# Patient Record
Sex: Female | Born: 1990 | Race: Black or African American | Hispanic: No | Marital: Single | Smoking: Never smoker
Health system: Southern US, Community
[De-identification: ages and names within clinical notes are randomized; demographics above are authoritative.]

## PROBLEM LIST (undated history)

## (undated) ENCOUNTER — Inpatient Hospital Stay (HOSPITAL_COMMUNITY): Payer: Self-pay

## (undated) DIAGNOSIS — Z789 Other specified health status: Secondary | ICD-10-CM

## (undated) HISTORY — PX: NO PAST SURGERIES: SHX2092

---

## 2014-02-03 ENCOUNTER — Ambulatory Visit: Payer: Self-pay | Admitting: Obstetrics & Gynecology

## 2014-10-04 ENCOUNTER — Encounter: Payer: Self-pay | Admitting: *Deleted

## 2014-10-05 ENCOUNTER — Encounter: Payer: Self-pay | Admitting: Obstetrics & Gynecology

## 2016-05-11 ENCOUNTER — Encounter: Payer: Self-pay | Admitting: Student

## 2016-05-11 ENCOUNTER — Inpatient Hospital Stay (HOSPITAL_COMMUNITY)
Admission: AD | Admit: 2016-05-11 | Discharge: 2016-05-11 | Disposition: A | Discharge status: Home / Self Care | Payer: Medicaid Other | Source: Ambulatory Visit | Attending: Obstetrics & Gynecology | Admitting: Obstetrics & Gynecology

## 2016-05-11 DIAGNOSIS — O219 Vomiting of pregnancy, unspecified: Secondary | ICD-10-CM

## 2016-05-11 DIAGNOSIS — O210 Mild hyperemesis gravidarum: Secondary | ICD-10-CM | POA: Insufficient documentation

## 2016-05-11 DIAGNOSIS — Z3A08 8 weeks gestation of pregnancy: Secondary | ICD-10-CM | POA: Insufficient documentation

## 2016-05-11 DIAGNOSIS — Z79899 Other long term (current) drug therapy: Secondary | ICD-10-CM | POA: Insufficient documentation

## 2016-05-11 LAB — URINALYSIS, ROUTINE W REFLEX MICROSCOPIC
GLUCOSE, UA: NEGATIVE mg/dL
HGB URINE DIPSTICK: NEGATIVE
LEUKOCYTES UA: NEGATIVE
Nitrite: NEGATIVE
PH: 5.5 (ref 5.0–8.0)
Protein, ur: 30 mg/dL — AB
Specific Gravity, Urine: >1.03 — ABNORMAL HIGH (ref 1.005–1.030)

## 2016-05-11 LAB — URINE MICROSCOPIC-ADD ON
Bacteria, UA: NONE SEEN
RBC / HPF: NONE SEEN RBC/hpf (ref 0–5)
WBC UA: NONE SEEN WBC/hpf (ref 0–5)

## 2016-05-11 LAB — POCT PREGNANCY, URINE: PREG TEST UR: POSITIVE — AB

## 2016-05-11 MED ORDER — DEXTROSE 5 % IN LACTATED RINGERS IV BOLUS
1000.0000 mL | Freq: Once | INTRAVENOUS | Status: AC
  Administered 2016-05-11: 1000 mL via INTRAVENOUS

## 2016-05-11 MED ORDER — METOCLOPRAMIDE HCL 5 MG/ML IJ SOLN
10.0000 mg | Freq: Once | INTRAMUSCULAR | Status: AC
  Administered 2016-05-11: 10 mg via INTRAVENOUS
  Filled 2016-05-11: qty 2

## 2016-05-11 MED ORDER — LACTATED RINGERS IV SOLN
Freq: Once | INTRAVENOUS | Status: AC
  Administered 2016-05-11: 19:00:00 via INTRAVENOUS
  Filled 2016-05-11: qty 1000

## 2016-05-11 MED ORDER — METOCLOPRAMIDE HCL 10 MG PO TABS: 10.0000 mg | ORAL_TABLET | Freq: Three times a day (TID) | ORAL | 1 refills | Status: DC

## 2016-05-11 NOTE — Discharge Instructions (Signed)

## 2016-05-11 NOTE — MAU Provider Note (Signed)
History     CSN: [id number]  Arrival date and time: 05/11/16 1539   First Provider Initiated Contact with Patient 05/11/16 1731      Chief Complaint  Patient presents with  . Morning Sickness   HPI   [name] is a 25 y.o. female G1P0 at [redacted]w[redacted]d here with N/V; she attests to vomiting at least 6 times per day. She has not tried anything for the symptoms.  She has her first appt with OBGYN next week.    OB History    Gravida Para Term Preterm AB Living   1             SAB TAB Ectopic Multiple Live Births                  History reviewed. No pertinent past medical history.  History reviewed. No pertinent surgical history.  History reviewed. No pertinent family history.  Social History  Substance Use Topics  . Smoking status: Never Smoker  . Smokeless tobacco: Never Used  . Alcohol use No    Allergies: No Known Allergies  Prescriptions Prior to Admission  Medication Sig Dispense Refill Last Dose  . Prenatal Vit-Fe Fumarate-FA (PRENATAL MULTIVITAMIN) TABS tablet Take 1 tablet by mouth daily at 12 noon.   Past Week at Unknown time   Results for orders placed or performed during the hospital encounter of 05/11/16 (from the past 48 hour(s))  Urinalysis, Routine w reflex microscopic (not at [facility])     Status: Abnormal   Collection Time: 05/11/16  3:55 PM  Result Value Ref Range   Color, Urine YELLOW YELLOW   APPearance HAZY (A) CLEAR   Specific Gravity, Urine >1.030 (H) 1.005 - 1.030   pH 5.5 5.0 - 8.0   Glucose, UA NEGATIVE NEGATIVE mg/dL   Hgb urine dipstick NEGATIVE NEGATIVE   Bilirubin Urine MODERATE (A) NEGATIVE   Ketones, ur >80 (A) NEGATIVE mg/dL   Protein, ur 30 (A) NEGATIVE mg/dL   Nitrite NEGATIVE NEGATIVE   Leukocytes, UA NEGATIVE NEGATIVE  Urine microscopic-add on     Status: Abnormal   Collection Time: 05/11/16  3:55 PM  Result Value Ref Range   Squamous Epithelial / LPF 0-5 (A) NONE SEEN   WBC, UA NONE SEEN 0 - 5 WBC/hpf   RBC / HPF NONE SEEN 0  - 5 RBC/hpf   Bacteria, UA NONE SEEN NONE SEEN   Urine-Other MUCOUS PRESENT     Comment: AMORPHOUS URATES/PHOSPHATES  Pregnancy, urine POC     Status: Abnormal   Collection Time: 05/11/16  4:05 PM  Result Value Ref Range   Preg Test, Ur POSITIVE (A) NEGATIVE    Comment:        THE SENSITIVITY OF THIS METHODOLOGY IS >24 mIU/mL     Review of Systems  Constitutional: Negative for chills and fever.  Gastrointestinal: Positive for nausea and vomiting. Negative for abdominal pain.  Genitourinary:       Denies vaginal bleeding    Physical Exam   Blood pressure 154/78, pulse 104, temperature 98.1 F (36.7 C), resp. rate 18, weight 228 lb (103.4 kg), last menstrual period 03/14/2016.  Physical Exam  Constitutional: She is oriented to person, place, and time. She appears well-developed and well-nourished. No distress.  HENT:  Head: Normocephalic.  Eyes: Pupils are equal, round, and reactive to light.  Respiratory: Effort normal.  Musculoskeletal: Normal range of motion.  Neurological: She is alert and oriented to person, place, and time.  Skin: Skin  is warm. She is not diaphoretic.  Psychiatric: Her behavior is normal.    MAU Course  Procedures  None  MDM  Urine shows > 80 ketones D5LR bolus LR bolus with MVI X 1 Reglan 10 mg IV X 1  Patient tolerating PO fluids and crackers. No vomiting noted in MAU.  Discussed patient with Dr. Langston Masker.   Assessment and Plan    A:   1. Nausea and vomiting during pregnancy     P:  Discharge home in stable condition Rx: Reglan Small, frequent meals Keep your next appointment with OB Return precautions discussed  First trimester warning signs discussed   Maureen Lope, NP 05/11/2016 7:42 PM

## 2016-05-11 NOTE — MAU Note (Signed)
Pt presents to MAU with complaints of nausea and vomiting for approximately a week. States that she is not able to keep anything down. Denies any vaginal bleeding or abnormal discharge

## 2016-05-29 ENCOUNTER — Ambulatory Visit (INDEPENDENT_AMBULATORY_CARE_PROVIDER_SITE_OTHER): Payer: Medicaid Other | Admitting: Obstetrics & Gynecology

## 2016-05-29 ENCOUNTER — Encounter: Payer: Self-pay | Admitting: Obstetrics & Gynecology

## 2016-05-29 ENCOUNTER — Telehealth: Payer: Self-pay | Admitting: *Deleted

## 2016-05-29 VITALS — BP 125/85 | HR 93 | Wt 225.0 lb

## 2016-05-29 DIAGNOSIS — Z3401 Encounter for supervision of normal first pregnancy, first trimester: Secondary | ICD-10-CM | POA: Diagnosis not present

## 2016-05-29 DIAGNOSIS — Z3490 Encounter for supervision of normal pregnancy, unspecified, unspecified trimester: Secondary | ICD-10-CM

## 2016-05-29 DIAGNOSIS — Z3492 Encounter for supervision of normal pregnancy, unspecified, second trimester: Secondary | ICD-10-CM

## 2016-05-29 NOTE — Telephone Encounter (Signed)
Order for MFM Nuchal order placed in system wrong.  Order corrected.

## 2016-05-30 DIAGNOSIS — Z3400 Encounter for supervision of normal first pregnancy, unspecified trimester: Secondary | ICD-10-CM | POA: Insufficient documentation

## 2016-05-30 NOTE — Progress Notes (Signed)
  Subjective:    Maureen [name] is a G1P0 4634w0d being seen today for her first obstetrical visit.  Her obstetrical history is significant for obesity. Patient does intend to breast feed. Pregnancy history fully reviewed.  Patient reports nausea.  Vitals:   05/29/16 1114  BP: 125/85  Pulse: 93  Weight: 225 lb (102.1 kg)    HISTORY: OB History  Gravida Para Term Preterm AB Living  1            SAB TAB Ectopic Multiple Live Births               # Outcome Date GA Lbr Len/2nd Weight Sex Delivery Anes PTL Lv  1 Current              No past medical history on file. No past surgical history on file. Family History  Problem Relation Age of Onset  . Diabetes Maternal Uncle   . Hypertension Maternal Grandmother   . Birth defects Neg Hx      Exam    Uterus:     Pelvic Exam:    Perineum: No Hemorrhoids   Vulva: normal   Vagina:  normal mucosa   pH:     Cervix: no lesions   Adnexa: normal adnexa   Bony Pelvis: average  System: Breast:  normal appearance, no masses or tenderness   Skin: normal coloration and turgor, no rashes    Neurologic: oriented, normal mood   Extremities: normal strength, tone, and muscle mass   HEENT thyroid without masses   Mouth/Teeth dental hygiene good   Neck supple   Cardiovascular: regular rate and rhythm, no murmurs or gallops   Respiratory:  appears well, vitals normal, no respiratory distress, acyanotic, normal RR, neck free of mass or lymphadenopathy, chest clear, no wheezing, crepitations, rhonchi, normal symmetric air entry   Abdomen: soft, non-tender; bowel sounds normal; no masses,  no organomegaly   Urinary: urethral meatus normal      Assessment:    Pregnancy: G1P0 Patient Active Problem List   Diagnosis Date Noted  . Supervision of normal first pregnancy, antepartum 05/30/2016        Plan:     Initial labs drawn. Prenatal vitamins. Problem list reviewed and updated. Genetic Screening discussed First Screen: ordered.  Ultrasound discussed; fetal survey: 18 weeks.  Follow up in 4 weeks. 50% of 30 min visit spent on counseling and coordination of care.  AFP after 15 weeks   ARNOLD,JAMES 05/30/2016

## 2016-05-31 LAB — PAP IG W/ RFLX HPV ASCU: PAP Smear Comment: 0

## 2016-05-31 LAB — GC/CHLAMYDIA PROBE AMP
CHLAMYDIA, DNA PROBE: NEGATIVE
Neisseria gonorrhoeae by PCR: NEGATIVE

## 2016-06-02 LAB — CULTURE, URINE COMPREHENSIVE

## 2016-06-04 ENCOUNTER — Encounter (HOSPITAL_COMMUNITY): Payer: Self-pay | Admitting: Obstetrics & Gynecology

## 2016-06-04 LAB — PRENATAL PROFILE I(LABCORP)
ANTIBODY SCREEN: NEGATIVE
BASOS ABS: 0 10*3/uL (ref 0.0–0.2)
Basos: 0 %
EOS (ABSOLUTE): 0 10*3/uL (ref 0.0–0.4)
EOS: 1 %
HEMOGLOBIN: 12.3 g/dL (ref 11.1–15.9)
Hematocrit: 36.8 % (ref 34.0–46.6)
Hepatitis B Surface Ag: NEGATIVE
IMMATURE GRANULOCYTES: 0 %
Immature Grans (Abs): 0 10*3/uL (ref 0.0–0.1)
LYMPHS ABS: 1.6 10*3/uL (ref 0.7–3.1)
Lymphs: 22 %
MCH: 27.1 pg (ref 26.6–33.0)
MCHC: 33.4 g/dL (ref 31.5–35.7)
MCV: 81 fL (ref 79–97)
MONOCYTES: 9 %
Monocytes Absolute: 0.6 10*3/uL (ref 0.1–0.9)
NEUTROS PCT: 68 %
Neutrophils Absolute: 4.9 10*3/uL (ref 1.4–7.0)
PLATELETS: 306 10*3/uL (ref 150–379)
RBC: 4.54 x10E6/uL (ref 3.77–5.28)
RDW: 15.6 % — ABNORMAL HIGH (ref 12.3–15.4)
RH TYPE: POSITIVE
RPR Ser Ql: NONREACTIVE
RUBELLA: 2.82 {index} (ref >0.99)
WBC: 7.1 10*3/uL (ref 3.4–10.8)

## 2016-06-04 LAB — HEMOGLOBINOPATHY EVALUATION
HGB A: 97.7 % (ref 94.0–98.0)
HGB C: 0 %
HGB S: 0 %
Hemoglobin A2 Quantitation: 2.3 % (ref 0.7–3.1)
Hemoglobin F Quantitation: 0 % (ref 0.0–2.0)

## 2016-06-04 LAB — HIV ANTIBODY (ROUTINE TESTING W REFLEX): HIV SCREEN 4TH GENERATION: NONREACTIVE

## 2016-06-04 LAB — OPIATE, QUANTITATIVE, URINE
OPIATES: NEGATIVE
OXYCODONE+OXYMORPHONE UR QL SCN: NEGATIVE

## 2016-06-14 ENCOUNTER — Ambulatory Visit (HOSPITAL_COMMUNITY)
Admission: RE | Admit: 2016-06-14 | Discharge: 2016-06-14 | Disposition: A | Discharge status: Home / Self Care | Payer: Medicaid Other | Source: Ambulatory Visit | Attending: Obstetrics & Gynecology | Admitting: Obstetrics & Gynecology

## 2016-06-14 ENCOUNTER — Encounter (HOSPITAL_COMMUNITY): Payer: Self-pay

## 2016-06-14 DIAGNOSIS — Z3A13 13 weeks gestation of pregnancy: Secondary | ICD-10-CM | POA: Insufficient documentation

## 2016-06-14 DIAGNOSIS — Z36 Encounter for antenatal screening of mother: Secondary | ICD-10-CM | POA: Diagnosis present

## 2016-06-14 DIAGNOSIS — Z3492 Encounter for supervision of normal pregnancy, unspecified, second trimester: Secondary | ICD-10-CM

## 2016-06-14 DIAGNOSIS — Z3402 Encounter for supervision of normal first pregnancy, second trimester: Secondary | ICD-10-CM

## 2016-06-14 HISTORY — DX: Other specified health status: Z78.9

## 2016-06-20 ENCOUNTER — Other Ambulatory Visit (HOSPITAL_COMMUNITY): Payer: Self-pay

## 2016-06-21 ENCOUNTER — Encounter: Payer: Self-pay | Admitting: *Deleted

## 2016-06-26 ENCOUNTER — Ambulatory Visit (INDEPENDENT_AMBULATORY_CARE_PROVIDER_SITE_OTHER): Payer: Medicaid Other | Admitting: Obstetrics and Gynecology

## 2016-06-26 DIAGNOSIS — Z3402 Encounter for supervision of normal first pregnancy, second trimester: Secondary | ICD-10-CM | POA: Diagnosis not present

## 2016-06-26 NOTE — Progress Notes (Signed)
Patient states that she overall feels well, other than having ocassional nausea.

## 2016-06-26 NOTE — Progress Notes (Signed)
Subjective:  Maureen DillonJasmine [name] is a 25 y.o. G1P0 at 5424w6d being seen today for ongoing prenatal care.  She is currently monitored for the following issues for this low-risk pregnancy and has Supervision of normal first pregnancy, antepartum on her problem list.  Patient reports nausea.  Contractions: Not present. Vag. Bleeding: None.  Movement: Present. Denies leaking of fluid.   The following portions of the patient's history were reviewed and updated as appropriate: allergies, current medications, past family history, past medical history, past social history, past surgical history and problem list. Problem list updated.  Objective:   Vitals:   06/26/16 0853  BP: 121/80  Pulse: (!) 102  Temp: 98.8 F (37.1 C)  Weight: 218 lb 9.6 oz (99.2 kg)    Fetal Status:     Movement: Present     General:  Alert, oriented and cooperative. Patient is in no acute distress.  Skin: Skin is warm and dry. No rash noted.   Cardiovascular: Normal heart rate noted  Respiratory: Normal respiratory effort, no problems with respiration noted  Abdomen: Soft, gravid, appropriate for gestational age. Pain/Pressure: Absent     Pelvic:  Cervical exam deferred        Extremities: Normal range of motion.  Edema: None  Mental Status: Normal mood and affect. Normal behavior. Normal judgment and thought content.   Urinalysis:      Assessment and Plan:  Pregnancy: G1P0 at 4924w6d  1. Supervision of normal first pregnancy, antepartum, second trimester States able to handle N/V. Only occurs every few days. Diet modifications reviewed with pt Will order U/S at next visit  Preterm labor symptoms and general obstetric precautions including but not limited to vaginal bleeding, contractions, leaking of fluid and fetal movement were reviewed in detail with the patient. Please refer to After Visit Summary for other counseling recommendations.  Return in about 4 weeks (around 07/24/2016) for OB visit.   Hermina StaggersMichael L Ervin,  MD

## 2016-07-17 ENCOUNTER — Ambulatory Visit (INDEPENDENT_AMBULATORY_CARE_PROVIDER_SITE_OTHER): Payer: Medicaid Other | Admitting: Obstetrics & Gynecology

## 2016-07-17 VITALS — BP 118/74 | HR 101 | Temp 98.2°F | Wt 224.6 lb

## 2016-07-17 DIAGNOSIS — Z3402 Encounter for supervision of normal first pregnancy, second trimester: Secondary | ICD-10-CM

## 2016-07-17 DIAGNOSIS — Z3400 Encounter for supervision of normal first pregnancy, unspecified trimester: Secondary | ICD-10-CM

## 2016-07-17 NOTE — Progress Notes (Signed)
   PRENATAL VISIT NOTE  Subjective:  Maureen [name] is a 25 y.o. G1P0 at 5050w6d being seen today for ongoing prenatal care.  She is currently monitored for the following issues for this low-risk pregnancy and has Supervision of normal first pregnancy, antepartum on her problem list.  Patient reports no complaints.  Contractions: Not present. Vag. Bleeding: None.  Movement: Present. Denies leaking of fluid.   The following portions of the patient's history were reviewed and updated as appropriate: allergies, current medications, past family history, past medical history, past social history, past surgical history and problem list. Problem list updated.  Objective:   Vitals:   07/17/16 0846  BP: 118/74  Pulse: (!) 101  Temp: 98.2 F (36.8 C)  Weight: 224 lb 9.6 oz (101.9 kg)    Fetal Status:     Movement: Present     General:  Alert, oriented and cooperative. Patient is in no acute distress.  Skin: Skin is warm and dry. No rash noted.   Cardiovascular: Normal heart rate noted  Respiratory: Normal respiratory effort, no problems with respiration noted  Abdomen: Soft, gravid, appropriate for gestational age. Pain/Pressure: Absent     Pelvic:  Cervical exam deferred        Extremities: Normal range of motion.  Edema: None  Mental Status: Normal mood and affect. Normal behavior. Normal judgment and thought content.   Urinalysis:      Assessment and Plan:  Pregnancy: G1P0 at 2150w6d  1. Supervision of normal first pregnancy, antepartum AFP only, nl NT - AFP, Quad Screen - US OB DETAIL + 14 WK; Future 2 weeks Preterm labor symptoms and general obstetric precautions including but not limited to vaginal bleeding, contractions, leaking of fluid and fetal movement were reviewed in detail with the patient. Please refer to After Visit Summary for other counseling recommendations.  Return in about 4 weeks (around 08/14/2016).  Adam PhenixJames G Arnold, MD

## 2016-07-17 NOTE — Progress Notes (Signed)
Patient is in office and states that she feels good and felt her first fetal kick the other day.

## 2016-07-24 LAB — AFP, QUAD SCREEN
DIA Mom Value: 1.31
DIA Value (EIA): 179.46 pg/mL
DSR (By Age)    1 IN: 1071
DSR (Second Trimester) 1 IN: 326
GESTATIONAL AGE AFP: 17.9 wk
MSAFP MOM: 0.7
MSAFP: 25.3 ng/mL
MSHCG MOM: 1.89
MSHCG: 40539 m[IU]/mL
Maternal Age At EDD: 24 YEARS
Osb Risk: 10000
Test Results:: NEGATIVE
UE3 MOM: 0.64
UE3 VALUE: 0.74 ng/mL
WEIGHT: 228 [lb_av]

## 2016-08-03 ENCOUNTER — Ambulatory Visit (INDEPENDENT_AMBULATORY_CARE_PROVIDER_SITE_OTHER): Payer: Medicaid Other

## 2016-08-03 DIAGNOSIS — Z3400 Encounter for supervision of normal first pregnancy, unspecified trimester: Secondary | ICD-10-CM

## 2016-08-03 DIAGNOSIS — Z3689 Encounter for other specified antenatal screening: Secondary | ICD-10-CM | POA: Diagnosis not present

## 2016-08-03 DIAGNOSIS — Z3402 Encounter for supervision of normal first pregnancy, second trimester: Secondary | ICD-10-CM | POA: Diagnosis not present

## 2016-08-14 ENCOUNTER — Ambulatory Visit (INDEPENDENT_AMBULATORY_CARE_PROVIDER_SITE_OTHER): Payer: Medicaid Other | Admitting: Certified Nurse Midwife

## 2016-08-14 DIAGNOSIS — Z3402 Encounter for supervision of normal first pregnancy, second trimester: Secondary | ICD-10-CM

## 2016-08-14 DIAGNOSIS — Z3400 Encounter for supervision of normal first pregnancy, unspecified trimester: Secondary | ICD-10-CM

## 2016-08-14 NOTE — Progress Notes (Signed)
Pt states that she is feeling movement less that before. Pediatrician list to be given today.

## 2016-08-14 NOTE — Progress Notes (Signed)
Subjective:    Ricard DillonJasmine Pankratz is a 25 y.o. female being seen today for her obstetrical visit. She is at 5918w6d gestation. Patient reports: no complaints . Fetal movement: normal.  Problem List Items Addressed This Visit      Other   Supervision of normal first pregnancy, antepartum     Patient Active Problem List   Diagnosis Date Noted  . Supervision of normal first pregnancy, antepartum 05/30/2016   Objective:    BP 108/73   Pulse 96   Wt 228 lb (103.4 kg)   LMP 03/14/2016 (Exact Date)  FHT: 146 BPM  Uterine Size: 22 cm and size equals dates     Assessment:    Pregnancy @ 4218w6d    Influenza today  Plan:    OBGCT: discussed. Signs and symptoms of preterm labor: discussed and handout given.  Labs, problem list reviewed and updated 2 hr GTT planned Follow up in 4 weeks.

## 2016-09-11 ENCOUNTER — Ambulatory Visit (INDEPENDENT_AMBULATORY_CARE_PROVIDER_SITE_OTHER): Payer: Medicaid Other | Admitting: Certified Nurse Midwife

## 2016-09-11 DIAGNOSIS — Z3482 Encounter for supervision of other normal pregnancy, second trimester: Secondary | ICD-10-CM

## 2016-09-11 DIAGNOSIS — Z3400 Encounter for supervision of normal first pregnancy, unspecified trimester: Secondary | ICD-10-CM

## 2016-09-11 NOTE — Progress Notes (Signed)
Subjective:    Maureen DillonJasmine [name] is a 25 y.o. female being seen today for her obstetrical visit. She is at 3475w6d gestation. Patient reports: no bleeding, no contractions, no cramping and no leaking . URI symptoms, denies fever, reports nasal congestion, cough, no sputum production.  Fetal movement: normal.  Problem List Items Addressed This Visit    None     Patient Active Problem List   Diagnosis Date Noted  . Supervision of normal first pregnancy, antepartum 05/30/2016   Objective:    BP 118/78   Pulse 97   Wt 229 lb (103.9 kg)   LMP 03/14/2016 (Exact Date)  FHT:  BPM  Uterine Size: 26 cm and size equals dates     Assessment:    Pregnancy @ 7475w6d    URI symptoms  Plan:    Flu and TDaP next ROB   OBGCT: discussed and ordered for next visit. Signs and symptoms of preterm labor: discussed.  Labs, problem list reviewed and updated 2 hr GTT planned Follow up in 2 weeks.

## 2016-09-11 NOTE — Progress Notes (Signed)
Pt states she is having cold symptoms, ? What to take.

## 2016-09-21 ENCOUNTER — Other Ambulatory Visit: Payer: Medicaid Other

## 2016-09-21 DIAGNOSIS — Z3400 Encounter for supervision of normal first pregnancy, unspecified trimester: Secondary | ICD-10-CM

## 2016-09-22 LAB — CBC
HEMATOCRIT: 31.2 % — AB (ref 34.0–46.6)
HEMOGLOBIN: 10.5 g/dL — AB (ref 11.1–15.9)
MCH: 27.8 pg (ref 26.6–33.0)
MCHC: 33.7 g/dL (ref 31.5–35.7)
MCV: 83 fL (ref 79–97)
Platelets: 299 10*3/uL (ref 150–379)
RBC: 3.78 x10E6/uL (ref 3.77–5.28)
RDW: 14.9 % (ref 12.3–15.4)
WBC: 9.1 10*3/uL (ref 3.4–10.8)

## 2016-09-22 LAB — RPR: RPR: NONREACTIVE

## 2016-09-22 LAB — GLUCOSE TOLERANCE, 2 HOURS W/ 1HR
GLUCOSE, 2 HOUR: 92 mg/dL (ref 65–152)
Glucose, 1 hour: 86 mg/dL (ref 65–179)
Glucose, Fasting: 73 mg/dL (ref 65–91)

## 2016-09-22 LAB — HIV ANTIBODY (ROUTINE TESTING W REFLEX): HIV SCREEN 4TH GENERATION: NONREACTIVE

## 2016-09-24 ENCOUNTER — Ambulatory Visit (INDEPENDENT_AMBULATORY_CARE_PROVIDER_SITE_OTHER): Payer: Medicaid Other | Admitting: Certified Nurse Midwife

## 2016-09-24 DIAGNOSIS — Z23 Encounter for immunization: Secondary | ICD-10-CM

## 2016-09-24 DIAGNOSIS — Z3402 Encounter for supervision of normal first pregnancy, second trimester: Secondary | ICD-10-CM

## 2016-09-24 DIAGNOSIS — Z3400 Encounter for supervision of normal first pregnancy, unspecified trimester: Secondary | ICD-10-CM

## 2016-09-24 NOTE — Progress Notes (Signed)
Subjective:    Maureen DillonJasmine [name] is a 25 y.o. female being seen today for her obstetrical visit. She is at 2075w5d gestation. Patient reports no complaints. Fetal movement: normal.  Problem List Items Addressed This Visit      Other   Supervision of normal first pregnancy, antepartum   Relevant Orders   Tdap vaccine greater than or equal to 23yo IM (Completed)     Patient Active Problem List   Diagnosis Date Noted  . Supervision of normal first pregnancy, antepartum 05/30/2016   Objective:    BP 116/75   Pulse (!) 108   Wt 227 lb (103 kg)   LMP 03/14/2016 (Exact Date)  FHT:  150 BPM  Uterine Size: 28 cm and size equals dates  Presentation: cephalic     Assessment:    Pregnancy @ 7275w5d weeks   Doing well   Plan:     labs reviewed, problem list updated  GBS planning TDAP today  Rhogam given for RH negative Pediatrician: discussed. Infant feeding: plans to breastfeed. Maternity leave: discussed. Cigarette smoking: never smoked. Orders Placed This Encounter  Procedures  . Tdap vaccine greater than or equal to 23yo IM   No orders of the defined types were placed in this encounter.  Follow up in 2 Weeks.

## 2016-10-08 NOTE — L&D Delivery Note (Signed)
Delivery Note At 7:52 PM a viable female was delivered via Vaginal, Spontaneous Delivery (Presentation:vertex ; ROA ).  APGAR: 9, 9; weight pending  .   Placenta status: spontaneous, intact.  Cord: 3 vessels with no complications: .  Cord pH: n/a  Anesthesia:   Episiotomy: None Lacerations: 2nd degree perineal; right perurethral Suture Repair: 1st degree 3.0 vicryl for perineal repair, posterior; 4.0 monocryl for periurethral repair Est. Blood Loss (mL):  300cc  Mom to postpartum.  Baby to Couplet care / Skin to Skin.  Nehemiah SettleAna Carvalho do Silvio ClaymanAmaral MD PGY1 12/14/2016, 9:08 PM  OB FELLOW DELIVERY ATTESTATION  I was gloved and present for the delivery in its entirety, and I agree with the above resident's note.    Jen MowElizabeth Mumaw, DO OB Fellow

## 2016-10-11 ENCOUNTER — Ambulatory Visit (INDEPENDENT_AMBULATORY_CARE_PROVIDER_SITE_OTHER): Payer: Medicaid Other | Admitting: Obstetrics

## 2016-10-11 ENCOUNTER — Encounter: Payer: Self-pay | Admitting: Obstetrics

## 2016-10-11 VITALS — BP 126/80 | HR 114 | Wt 236.4 lb

## 2016-10-11 DIAGNOSIS — Z3483 Encounter for supervision of other normal pregnancy, third trimester: Secondary | ICD-10-CM | POA: Diagnosis not present

## 2016-10-11 DIAGNOSIS — K219 Gastro-esophageal reflux disease without esophagitis: Secondary | ICD-10-CM | POA: Diagnosis not present

## 2016-10-11 DIAGNOSIS — Z3480 Encounter for supervision of other normal pregnancy, unspecified trimester: Secondary | ICD-10-CM

## 2016-10-11 MED ORDER — RANITIDINE HCL 150 MG PO TABS: 150.0000 mg | ORAL_TABLET | Freq: Two times a day (BID) | ORAL | 5 refills | Status: DC

## 2016-10-11 NOTE — Progress Notes (Signed)
Subjective:    Maureen [name] is a 26 y.o. female being seen today for her obstetrical visit. She is at 6371w1d gestation. Patient reports heartburn. Fetal movement: normal.  Problem List Items Addressed This Visit    None     Patient Active Problem List   Diagnosis Date Noted  . Supervision of normal first pregnancy, antepartum 05/30/2016   Objective:    BP 126/80   Pulse (!) 114   Wt 236 lb 6.4 oz (107.2 kg)   LMP 03/14/2016 (Exact Date)  FHT:  150 BPM  Uterine Size: size equals dates  Presentation: unsure     Assessment:    Pregnancy @ 571w1d weeks    Heartburn  Plan:    Zantac Rx   labs reviewed, problem list updated Consent signed. GBS sent TDAP offered  Rhogam given for RH negative Pediatrician: discussed. Infant feeding: plans to breastfeed. Maternity leave: discussed. Cigarette smoking: never smoked. No orders of the defined types were placed in this encounter.  No orders of the defined types were placed in this encounter.  Follow up in 2 Weeks.   Patient ID: Maureen DillonJasmine [name], female   DOB: 1991-09-19, 26 y.o.   MRN: [id number]

## 2016-10-11 NOTE — Addendum Note (Signed)
Addended by: Coral CeoHARPER, Caressa Scearce A on: 10/11/2016 10:41 AM   Modules accepted: Orders

## 2016-10-11 NOTE — Progress Notes (Signed)
Patient is doing well

## 2016-10-25 ENCOUNTER — Encounter: Payer: Medicaid Other | Admitting: Certified Nurse Midwife

## 2016-11-05 ENCOUNTER — Encounter: Payer: Medicaid Other | Admitting: Obstetrics

## 2016-11-13 ENCOUNTER — Ambulatory Visit (INDEPENDENT_AMBULATORY_CARE_PROVIDER_SITE_OTHER): Payer: Medicaid Other | Admitting: Certified Nurse Midwife

## 2016-11-13 VITALS — BP 112/76 | HR 103 | Wt 229.0 lb

## 2016-11-13 DIAGNOSIS — Z3400 Encounter for supervision of normal first pregnancy, unspecified trimester: Secondary | ICD-10-CM

## 2016-11-13 DIAGNOSIS — Z3403 Encounter for supervision of normal first pregnancy, third trimester: Secondary | ICD-10-CM

## 2016-11-13 NOTE — Progress Notes (Signed)
   PRENATAL VISIT NOTE  Subjective:  Maureen [name] is a 26 y.o. G1P0 at 7938w6d being seen today for ongoing prenatal care.  She is currently monitored for the following issues for this low-risk pregnancy and has Supervision of normal first pregnancy, antepartum on her problem list.  Patient reports no complaints.  Contractions: Not present. Vag. Bleeding: None.  Movement: Present. Denies leaking of fluid.   The following portions of the patient's history were reviewed and updated as appropriate: allergies, current medications, past family history, past medical history, past social history, past surgical history and problem list. Problem list updated.  Objective:   Vitals:   11/13/16 1430  BP: 112/76  Pulse: (!) 103  Weight: 229 lb (103.9 kg)    Fetal Status: Fetal Heart Rate (bpm): 154 Fundal Height: 33 cm Movement: Present     General:  Alert, oriented and cooperative. Patient is in no acute distress.  Skin: Skin is warm and dry. No rash noted.   Cardiovascular: Normal heart rate noted  Respiratory: Normal respiratory effort, no problems with respiration noted  Abdomen: Soft, gravid, appropriate for gestational age. Pain/Pressure: Absent     Pelvic:  Cervical exam deferred        Extremities: Normal range of motion.     Mental Status: Normal mood and affect. Normal behavior. Normal judgment and thought content.   Assessment and Plan:  Pregnancy: G1P0 at 3238w6d  1. Supervision of normal first pregnancy, antepartum     GBS culture next week.   Preterm labor symptoms and general obstetric precautions including but not limited to vaginal bleeding, contractions, leaking of fluid and fetal movement were reviewed in detail with the patient. Please refer to After Visit Summary for other counseling recommendations.  Return in about 1 week (around 11/20/2016) for ROB, GBS.   Roe Coombsachelle A Chevonne Bostrom, CNM

## 2016-11-20 ENCOUNTER — Ambulatory Visit (INDEPENDENT_AMBULATORY_CARE_PROVIDER_SITE_OTHER): Payer: Medicaid Other | Admitting: Certified Nurse Midwife

## 2016-11-20 ENCOUNTER — Other Ambulatory Visit (HOSPITAL_COMMUNITY)
Admission: RE | Admit: 2016-11-20 | Discharge: 2016-11-20 | Disposition: A | Discharge status: Home / Self Care | Payer: Medicaid Other | Source: Ambulatory Visit | Attending: Certified Nurse Midwife | Admitting: Certified Nurse Midwife

## 2016-11-20 VITALS — BP 114/76 | HR 103 | Wt 228.0 lb

## 2016-11-20 DIAGNOSIS — Z3403 Encounter for supervision of normal first pregnancy, third trimester: Secondary | ICD-10-CM | POA: Diagnosis present

## 2016-11-20 DIAGNOSIS — Z3A35 35 weeks gestation of pregnancy: Secondary | ICD-10-CM | POA: Diagnosis not present

## 2016-11-20 DIAGNOSIS — Z3400 Encounter for supervision of normal first pregnancy, unspecified trimester: Secondary | ICD-10-CM

## 2016-11-20 NOTE — Progress Notes (Signed)
   PRENATAL VISIT NOTE  Subjective:  Maureen DillonJasmine [name] is a 26 y.o. G1P0 at 1977w6d being seen today for ongoing prenatal care.  She is currently monitored for the following issues for this low-risk pregnancy and has Supervision of normal first pregnancy, antepartum on her problem list.  Patient reports no complaints.  Contractions: Not present. Vag. Bleeding: None.  Movement: Present. Denies leaking of fluid.   The following portions of the patient's history were reviewed and updated as appropriate: allergies, current medications, past family history, past medical history, past social history, past surgical history and problem list. Problem list updated.  Objective:   Vitals:   11/20/16 1445  BP: 114/76  Pulse: (!) 103  Weight: 228 lb (103.4 kg)    Fetal Status: Fetal Heart Rate (bpm): 138 Fundal Height: 35 cm Movement: Present  Presentation: Vertex  General:  Alert, oriented and cooperative. Patient is in no acute distress.  Skin: Skin is warm and dry. No rash noted.   Cardiovascular: Normal heart rate noted  Respiratory: Normal respiratory effort, no problems with respiration noted  Abdomen: Soft, gravid, appropriate for gestational age. Pain/Pressure: Absent     Pelvic:  Cervical exam performed Dilation: Closed Effacement (%): 0 Station: -3  Extremities: Normal range of motion.     Mental Status: Normal mood and affect. Normal behavior. Normal judgment and thought content.   Assessment and Plan:  Pregnancy: G1P0 at 3577w6d  1. Supervision of normal first pregnancy, antepartum      Doing well.  - Strep Gp B NAA - Cervicovaginal ancillary only  Preterm labor symptoms and general obstetric precautions including but not limited to vaginal bleeding, contractions, leaking of fluid and fetal movement were reviewed in detail with the patient. Please refer to After Visit Summary for other counseling recommendations.  Return in about 1 week (around 11/27/2016) for ROB.   Roe Coombsachelle A Detria Cummings,  CNM

## 2016-11-21 LAB — CERVICOVAGINAL ANCILLARY ONLY
BACTERIAL VAGINITIS: NEGATIVE
CANDIDA VAGINITIS: NEGATIVE
Chlamydia: NEGATIVE
NEISSERIA GONORRHEA: NEGATIVE
Trichomonas: NEGATIVE

## 2016-11-22 LAB — STREP GP B NAA: STREP GROUP B AG: NEGATIVE

## 2016-11-28 ENCOUNTER — Ambulatory Visit (INDEPENDENT_AMBULATORY_CARE_PROVIDER_SITE_OTHER): Payer: Medicaid Other | Admitting: Certified Nurse Midwife

## 2016-11-28 DIAGNOSIS — Z3400 Encounter for supervision of normal first pregnancy, unspecified trimester: Secondary | ICD-10-CM

## 2016-11-28 DIAGNOSIS — Z3403 Encounter for supervision of normal first pregnancy, third trimester: Secondary | ICD-10-CM

## 2016-11-28 NOTE — Progress Notes (Signed)
   PRENATAL VISIT NOTE  Subjective:  Maureen [name] is a 26 y.o. G1P0 at 3361w0d being seen today for ongoing prenatal care.  She is currently monitored for the following issues for this low-risk pregnancy and has Supervision of normal first pregnancy, antepartum on her problem list.  Patient reports no complaints.  Contractions: Not present. Vag. Bleeding: None.  Movement: Present. Denies leaking of fluid.   The following portions of the patient's history were reviewed and updated as appropriate: allergies, current medications, past family history, past medical history, past social history, past surgical history and problem list. Problem list updated.  Objective:   Vitals:   11/28/16 1317  BP: 107/77  Pulse: (!) 114  Weight: 229 lb (103.9 kg)    Fetal Status: Fetal Heart Rate (bpm): 146 Fundal Height: 37 cm Movement: Present  Presentation: Vertex  General:  Alert, oriented and cooperative. Patient is in no acute distress.  Skin: Skin is warm and dry. No rash noted.   Cardiovascular: Normal heart rate noted  Respiratory: Normal respiratory effort, no problems with respiration noted  Abdomen: Soft, gravid, appropriate for gestational age. Pain/Pressure: Present     Pelvic:  Cervical exam performed Dilation: Closed Effacement (%): 20 Station: -3  Extremities: Normal range of motion.     Mental Status: Normal mood and affect. Normal behavior. Normal judgment and thought content.   Assessment and Plan:  Pregnancy: G1P0 at 1261w0d  1. Supervision of normal first pregnancy, antepartum     Doing well.  Term labor symptoms and general obstetric precautions including but not limited to vaginal bleeding, contractions, leaking of fluid and fetal movement were reviewed in detail with the patient. Please refer to After Visit Summary for other counseling recommendations.  Return in about 1 week (around 12/05/2016) for ROB.   Roe Coombsachelle A Kritika Stukes, CNM

## 2016-12-05 ENCOUNTER — Ambulatory Visit (INDEPENDENT_AMBULATORY_CARE_PROVIDER_SITE_OTHER): Payer: Medicaid Other | Admitting: Certified Nurse Midwife

## 2016-12-05 ENCOUNTER — Encounter: Payer: Self-pay | Admitting: Certified Nurse Midwife

## 2016-12-05 VITALS — BP 120/82 | HR 112 | Wt 232.0 lb

## 2016-12-05 DIAGNOSIS — Z3400 Encounter for supervision of normal first pregnancy, unspecified trimester: Secondary | ICD-10-CM

## 2016-12-05 DIAGNOSIS — Z3403 Encounter for supervision of normal first pregnancy, third trimester: Secondary | ICD-10-CM

## 2016-12-05 NOTE — Progress Notes (Signed)
Pt would like cervical exam today. 

## 2016-12-05 NOTE — Progress Notes (Signed)
   PRENATAL VISIT NOTE  Subjective:  Maureen DillonJasmine Costabile is a 26 y.o. G1P0 at 4917w0d being seen today for ongoing prenatal care.  She is currently monitored for the following issues for this low-risk pregnancy and has Supervision of normal first pregnancy, antepartum on her problem list.  Patient reports no complaints.  Contractions: Irregular. Vag. Bleeding: None.  Movement: Present. Denies leaking of fluid.   The following portions of the patient's history were reviewed and updated as appropriate: allergies, current medications, past family history, past medical history, past social history, past surgical history and problem list. Problem list updated.  Objective:   Vitals:   12/05/16 1353  BP: 120/82  Pulse: (!) 112  Weight: 232 lb (105.2 kg)    Fetal Status: Fetal Heart Rate (bpm): 147 Fundal Height: 38 cm Movement: Present  Presentation: Vertex  General:  Alert, oriented and cooperative. Patient is in no acute distress.  Skin: Skin is warm and dry. No rash noted.   Cardiovascular: Normal heart rate noted  Respiratory: Normal respiratory effort, no problems with respiration noted  Abdomen: Soft, gravid, appropriate for gestational age. Pain/Pressure: Present     Pelvic:  Cervical exam performed Dilation: 2 Effacement (%): 50 Station: -3  Extremities: Normal range of motion.     Mental Status: Normal mood and affect. Normal behavior. Normal judgment and thought content.   Assessment and Plan:  Pregnancy: G1P0 at 5917w0d  1. Supervision of normal first pregnancy, antepartum      Doing well.  Labor s/s discussed  Term labor symptoms and general obstetric precautions including but not limited to vaginal bleeding, contractions, leaking of fluid and fetal movement were reviewed in detail with the patient. Please refer to After Visit Summary for other counseling recommendations.  Return in about 1 week (around 12/12/2016) for ROB.   Roe Coombsachelle A Nimai Burbach, CNM

## 2016-12-07 ENCOUNTER — Inpatient Hospital Stay (HOSPITAL_COMMUNITY)
Admission: AD | Admit: 2016-12-07 | Discharge: 2016-12-07 | Disposition: A | Discharge status: Home / Self Care | Payer: Medicaid Other | Source: Ambulatory Visit | Attending: Obstetrics and Gynecology | Admitting: Obstetrics and Gynecology

## 2016-12-07 DIAGNOSIS — O479 False labor, unspecified: Secondary | ICD-10-CM | POA: Diagnosis not present

## 2016-12-07 DIAGNOSIS — Z3A38 38 weeks gestation of pregnancy: Secondary | ICD-10-CM | POA: Insufficient documentation

## 2016-12-07 DIAGNOSIS — O4693 Antepartum hemorrhage, unspecified, third trimester: Secondary | ICD-10-CM | POA: Diagnosis present

## 2016-12-07 DIAGNOSIS — O471 False labor at or after 37 completed weeks of gestation: Secondary | ICD-10-CM | POA: Insufficient documentation

## 2016-12-07 LAB — URINALYSIS, ROUTINE W REFLEX MICROSCOPIC
Bilirubin Urine: NEGATIVE
Glucose, UA: NEGATIVE mg/dL
Hgb urine dipstick: NEGATIVE
Ketones, ur: NEGATIVE mg/dL
Nitrite: NEGATIVE
PROTEIN: 30 mg/dL — AB
Specific Gravity, Urine: 1.023 (ref 1.005–1.030)
pH: 5 (ref 5.0–8.0)

## 2016-12-07 NOTE — MAU Note (Signed)
Pt states that she noticed dark red blood with wiping after urination but states that it has stopped now. She also states she has lost her mucous plug. Pt denies contractions or leaking fluid. Will place on EFM. Carmelina DaneERRI L Rasheema Truluck, RN

## 2016-12-07 NOTE — Discharge Instructions (Signed)

## 2016-12-07 NOTE — MAU Provider Note (Signed)
  History     CSN: [id number]  Arrival date and time: 12/07/16 1406   None     Chief Complaint  Patient presents with  . Vaginal Bleeding   HPI 26 yo G1P0 at [redacted]w[redacted]d brought in by EMS for the evaluation of vaginal bleeding. Patient reports removing a big blog of mucus that was the color of blood when she wiped after urination. She denies any contractions or leakage of fluid. She reports good fetal movement. She has had uncomplicated prenatal care thus far at [facility]   Past Medical History:  Diagnosis Date  . Medical history non-contributory     Past Surgical History:  Procedure Laterality Date  . NO PAST SURGERIES      Family History  Problem Relation Age of Onset  . Diabetes Maternal Uncle   . Hypertension Maternal Grandmother   . Birth defects Neg Hx     Social History  Substance Use Topics  . Smoking status: Never Smoker  . Smokeless tobacco: Never Used  . Alcohol use No    Allergies: No Known Allergies  Prescriptions Prior to Admission  Medication Sig Dispense Refill Last Dose  . Prenatal Vit-Fe Fumarate-FA (PRENATAL MULTIVITAMIN) TABS tablet Take 1 tablet by mouth daily at 12 noon.   12/06/2016 at Unknown time  . ranitidine (ZANTAC) 150 MG tablet Take 1 tablet (150 mg total) by mouth 2 (two) times daily. 60 tablet 5 Past Month at Unknown time    Review of Systems  See pertinent in HPI Physical Exam   Blood pressure 132/79, pulse 110, temperature 98.2 F (36.8 C), temperature source Oral, resp. rate 16, height  (1.676 m), weight 232 lb (105.2 kg), last menstrual period 03/14/2016, SpO2 98 %.  Physical Exam GENERAL: Well-developed, well-nourished female in no acute distress. She was found laughing in bed while conversing with her mother ABDOMEN: Soft, nontender, gravid PELVIC: Normal external female genitalia. Vagina is pink and rugated.  Normal discharge. Normal appearing cervix.  Cervix: 2/50/ballotable EXTREMITIES: No cyanosis, clubbing, or edema, 2+  distal pulses. FHT: baseline 150, mod variability, +accels, no decels Toco: irregular contractions MAU Course  Procedures  MDM   Assessment and Plan  26 yo G1P0 at [redacted]w[redacted]d with false labor - Reassurance provided - precautions reviewed - patient to follow up as scheduled next week for prenatal care  Maureen [name] 12/07/2016, 3:03 PM

## 2016-12-13 ENCOUNTER — Ambulatory Visit (INDEPENDENT_AMBULATORY_CARE_PROVIDER_SITE_OTHER): Payer: Medicaid Other | Admitting: Certified Nurse Midwife

## 2016-12-13 VITALS — BP 110/78 | HR 109 | Wt 234.6 lb

## 2016-12-13 DIAGNOSIS — Z3403 Encounter for supervision of normal first pregnancy, third trimester: Secondary | ICD-10-CM

## 2016-12-13 DIAGNOSIS — Z3400 Encounter for supervision of normal first pregnancy, unspecified trimester: Secondary | ICD-10-CM

## 2016-12-13 NOTE — Progress Notes (Signed)
Patient reports she lost her mucus plug- some pressure- but that is about all.

## 2016-12-13 NOTE — Progress Notes (Signed)
   PRENATAL VISIT NOTE  Subjective:  Maureen [name] is a 26 y.o. G1P0 at 4832w1d being seen today for ongoing prenatal care.  She is currently monitored for the following issues for this low-risk pregnancy and has Supervision of normal first pregnancy, antepartum on her problem list.  Patient reports no complaints.  Contractions: Irregular. Vag. Bleeding: None.  Movement: Present. Denies leaking of fluid.   The following portions of the patient's history were reviewed and updated as appropriate: allergies, current medications, past family history, past medical history, past social history, past surgical history and problem list. Problem list updated.  Objective:   Vitals:   12/13/16 1335 12/13/16 1406  BP: (!) 142/84 110/78  Pulse: (!) 109   Weight: 234 lb 9.6 oz (106.4 kg)     Fetal Status: Fetal Heart Rate (bpm): 145 Fundal Height: 39 cm Movement: Present  Presentation: Vertex  General:  Alert, oriented and cooperative. Patient is in no acute distress.  Skin: Skin is warm and dry. No rash noted.   Cardiovascular: Normal heart rate noted  Respiratory: Normal respiratory effort, no problems with respiration noted  Abdomen: Soft, gravid, appropriate for gestational age. Pain/Pressure: Absent     Pelvic:  Cervical exam performed Dilation: 3 Effacement (%): 50 Station: -2  Extremities: Normal range of motion.  Edema: None  Mental Status: Normal mood and affect. Normal behavior. Normal judgment and thought content.   Assessment and Plan:  Pregnancy: G1P0 at 232w1d  1. Supervision of normal first pregnancy, antepartum     Doing well.  B/P normotensive on recheck a few minutes later (110/78).   Term labor symptoms and general obstetric precautions including but not limited to vaginal bleeding, contractions, leaking of fluid and fetal movement were reviewed in detail with the patient. Please refer to After Visit Summary for other counseling recommendations.  Return in about 1 week (around  12/20/2016) for ROB.   Roe Coombsachelle A Gay Rape, CNM

## 2016-12-14 ENCOUNTER — Inpatient Hospital Stay (HOSPITAL_COMMUNITY): Payer: Medicaid Other | Admitting: Anesthesiology

## 2016-12-14 ENCOUNTER — Inpatient Hospital Stay (HOSPITAL_COMMUNITY)
Admission: AD | Admit: 2016-12-14 | Discharge: 2016-12-16 | DRG: 775 | Disposition: A | Discharge status: Home / Self Care | Payer: Medicaid Other | Source: Ambulatory Visit | Attending: Obstetrics & Gynecology | Admitting: Obstetrics & Gynecology

## 2016-12-14 ENCOUNTER — Encounter (HOSPITAL_COMMUNITY): Payer: Self-pay

## 2016-12-14 DIAGNOSIS — O4202 Full-term premature rupture of membranes, onset of labor within 24 hours of rupture: Secondary | ICD-10-CM | POA: Diagnosis present

## 2016-12-14 DIAGNOSIS — Z833 Family history of diabetes mellitus: Secondary | ICD-10-CM

## 2016-12-14 DIAGNOSIS — Z3A39 39 weeks gestation of pregnancy: Secondary | ICD-10-CM

## 2016-12-14 DIAGNOSIS — Z8249 Family history of ischemic heart disease and other diseases of the circulatory system: Secondary | ICD-10-CM | POA: Diagnosis not present

## 2016-12-14 LAB — CBC
HCT: 32.8 % — ABNORMAL LOW (ref 36.0–46.0)
Hemoglobin: 10.8 g/dL — ABNORMAL LOW (ref 12.0–15.0)
MCH: 27.1 pg (ref 26.0–34.0)
MCHC: 32.9 g/dL (ref 30.0–36.0)
MCV: 82.4 fL (ref 78.0–100.0)
Platelets: 267 10*3/uL (ref 150–400)
RBC: 3.98 MIL/uL (ref 3.87–5.11)
RDW: 15.5 % (ref 11.5–15.5)
WBC: 9.9 10*3/uL (ref 4.0–10.5)

## 2016-12-14 LAB — ABO/RH: ABO/RH(D): B POS

## 2016-12-14 LAB — TYPE AND SCREEN
ABO/RH(D): B POS
Antibody Screen: NEGATIVE

## 2016-12-14 MED ORDER — PHENYLEPHRINE 40 MCG/ML (10ML) SYRINGE FOR IV PUSH (FOR BLOOD PRESSURE SUPPORT)
80.0000 ug | PREFILLED_SYRINGE | INTRAVENOUS | Status: DC | PRN
  Filled 2016-12-14: qty 10
  Filled 2016-12-14: qty 5

## 2016-12-14 MED ORDER — COCONUT OIL OIL: 1.0000 "application " | TOPICAL_OIL | Status: DC | PRN

## 2016-12-14 MED ORDER — DIPHENHYDRAMINE HCL 25 MG PO CAPS: 25.0000 mg | ORAL_CAPSULE | Freq: Four times a day (QID) | ORAL | Status: DC | PRN

## 2016-12-14 MED ORDER — LACTATED RINGERS IV SOLN
INTRAVENOUS | Status: DC
  Administered 2016-12-14: 18:00:00 via INTRAVENOUS

## 2016-12-14 MED ORDER — TETANUS-DIPHTH-ACELL PERTUSSIS 5-2.5-18.5 LF-MCG/0.5 IM SUSP: 0.5000 mL | Freq: Once | INTRAMUSCULAR | Status: DC

## 2016-12-14 MED ORDER — BENZOCAINE-MENTHOL 20-0.5 % EX AERO
1.0000 "application " | INHALATION_SPRAY | CUTANEOUS | Status: DC | PRN
  Filled 2016-12-14: qty 56

## 2016-12-14 MED ORDER — OXYTOCIN 40 UNITS IN LACTATED RINGERS INFUSION - SIMPLE MED
2.5000 [IU]/h | INTRAVENOUS | Status: DC
  Filled 2016-12-14: qty 1000

## 2016-12-14 MED ORDER — DIBUCAINE 1 % RE OINT: 1.0000 "application " | TOPICAL_OINTMENT | RECTAL | Status: DC | PRN

## 2016-12-14 MED ORDER — ONDANSETRON HCL 4 MG PO TABS: 4.0000 mg | ORAL_TABLET | ORAL | Status: DC | PRN

## 2016-12-14 MED ORDER — SOD CITRATE-CITRIC ACID 500-334 MG/5ML PO SOLN: 30.0000 mL | ORAL | Status: DC | PRN

## 2016-12-14 MED ORDER — ZOLPIDEM TARTRATE 5 MG PO TABS: 5.0000 mg | ORAL_TABLET | Freq: Every evening | ORAL | Status: DC | PRN

## 2016-12-14 MED ORDER — OXYTOCIN BOLUS FROM INFUSION
500.0000 mL | Freq: Once | INTRAVENOUS | Status: AC
  Administered 2016-12-14: 500 mL via INTRAVENOUS

## 2016-12-14 MED ORDER — SENNOSIDES-DOCUSATE SODIUM 8.6-50 MG PO TABS
2.0000 | ORAL_TABLET | ORAL | Status: DC
  Administered 2016-12-14 – 2016-12-16 (×2): 2 via ORAL
  Filled 2016-12-14 (×2): qty 2

## 2016-12-14 MED ORDER — LACTATED RINGERS IV SOLN: 500.0000 mL | INTRAVENOUS | Status: DC | PRN

## 2016-12-14 MED ORDER — ACETAMINOPHEN 325 MG PO TABS: 650.0000 mg | ORAL_TABLET | ORAL | Status: DC | PRN

## 2016-12-14 MED ORDER — SIMETHICONE 80 MG PO CHEW: 80.0000 mg | CHEWABLE_TABLET | ORAL | Status: DC | PRN

## 2016-12-14 MED ORDER — EPHEDRINE 5 MG/ML INJ
10.0000 mg | INTRAVENOUS | Status: DC | PRN
  Filled 2016-12-14: qty 4

## 2016-12-14 MED ORDER — OXYCODONE-ACETAMINOPHEN 5-325 MG PO TABS: 1.0000 | ORAL_TABLET | ORAL | Status: DC | PRN

## 2016-12-14 MED ORDER — ONDANSETRON HCL 4 MG/2ML IJ SOLN
4.0000 mg | Freq: Four times a day (QID) | INTRAMUSCULAR | Status: DC | PRN
  Administered 2016-12-14: 4 mg via INTRAVENOUS
  Filled 2016-12-14: qty 2

## 2016-12-14 MED ORDER — FENTANYL 2.5 MCG/ML BUPIVACAINE 1/10 % EPIDURAL INFUSION (WH - ANES)
14.0000 mL/h | INTRAMUSCULAR | Status: DC | PRN
  Administered 2016-12-14: 14 mL/h via EPIDURAL
  Filled 2016-12-14: qty 100

## 2016-12-14 MED ORDER — PHENYLEPHRINE 40 MCG/ML (10ML) SYRINGE FOR IV PUSH (FOR BLOOD PRESSURE SUPPORT)
80.0000 ug | PREFILLED_SYRINGE | INTRAVENOUS | Status: DC | PRN
  Filled 2016-12-14: qty 5

## 2016-12-14 MED ORDER — PRENATAL MULTIVITAMIN CH
1.0000 | ORAL_TABLET | Freq: Every day | ORAL | Status: DC
  Administered 2016-12-15: 1 via ORAL
  Filled 2016-12-14: qty 1

## 2016-12-14 MED ORDER — FENTANYL CITRATE (PF) 100 MCG/2ML IJ SOLN
100.0000 ug | INTRAMUSCULAR | Status: DC | PRN
  Administered 2016-12-14: 100 ug via INTRAVENOUS
  Filled 2016-12-14: qty 2

## 2016-12-14 MED ORDER — IBUPROFEN 600 MG PO TABS
600.0000 mg | ORAL_TABLET | Freq: Four times a day (QID) | ORAL | Status: DC
  Administered 2016-12-14 – 2016-12-16 (×6): 600 mg via ORAL
  Filled 2016-12-14 (×6): qty 1

## 2016-12-14 MED ORDER — MISOPROSTOL 200 MCG PO TABS
ORAL_TABLET | ORAL | Status: AC
  Administered 2016-12-14: 200 ug
  Filled 2016-12-14: qty 1

## 2016-12-14 MED ORDER — DIPHENHYDRAMINE HCL 50 MG/ML IJ SOLN: 12.5000 mg | INTRAMUSCULAR | Status: DC | PRN

## 2016-12-14 MED ORDER — LIDOCAINE HCL (PF) 1 % IJ SOLN
30.0000 mL | INTRAMUSCULAR | Status: DC | PRN
  Administered 2016-12-14: 30 mL via SUBCUTANEOUS
  Filled 2016-12-14: qty 30

## 2016-12-14 MED ORDER — LIDOCAINE HCL (PF) 1 % IJ SOLN
INTRAMUSCULAR | Status: DC | PRN
  Administered 2016-12-14: 6 mL via EPIDURAL
  Administered 2016-12-14: 7 mL via EPIDURAL

## 2016-12-14 MED ORDER — ONDANSETRON HCL 4 MG/2ML IJ SOLN: 4.0000 mg | INTRAMUSCULAR | Status: DC | PRN

## 2016-12-14 MED ORDER — WITCH HAZEL-GLYCERIN EX PADS: 1.0000 "application " | MEDICATED_PAD | CUTANEOUS | Status: DC | PRN

## 2016-12-14 MED ORDER — LACTATED RINGERS IV SOLN
500.0000 mL | Freq: Once | INTRAVENOUS | Status: AC
  Administered 2016-12-14: 500 mL via INTRAVENOUS

## 2016-12-14 MED ORDER — OXYCODONE-ACETAMINOPHEN 5-325 MG PO TABS: 2.0000 | ORAL_TABLET | ORAL | Status: DC | PRN

## 2016-12-14 NOTE — MAU Note (Signed)
Urine in lab no current orders in

## 2016-12-14 NOTE — H&P (Signed)
LABOR AND DELIVERY ADMISSION HISTORY AND PHYSICAL NOTE  Maureen [name] is a 26 y.o. female G1P0 with IUP at 2461w2d by LMP conistent with anatomy scan presenting for SROM at 1100am.   She reports positive fetal movement. She denies leakage of fluid or vaginal bleeding.  Prenatal History/Complications:  Past Medical History: Past Medical History:  Diagnosis Date  . Medical history non-contributory     Past Surgical History: Past Surgical History:  Procedure Laterality Date  . NO PAST SURGERIES      Obstetrical History: OB History    Gravida Para Term Preterm AB Living   1             SAB TAB Ectopic Multiple Live Births                  Social History: Social History   Social History  . Marital status: Single    Spouse name: N/A  . Number of children: N/A  . Years of education: N/A   Social History Main Topics  . Smoking status: Never Smoker  . Smokeless tobacco: Never Used  . Alcohol use No  . Drug use: No  . Sexual activity: No   Other Topics Concern  . None   Social History Narrative  . None    Family History: Family History  Problem Relation Age of Onset  . Diabetes Maternal Uncle   . Hypertension Maternal Grandmother   . Birth defects Neg Hx     Allergies: No Known Allergies  Prescriptions Prior to Admission  Medication Sig Dispense Refill Last Dose  . Prenatal Vit-Fe Fumarate-FA (PRENATAL MULTIVITAMIN) TABS tablet Take 1 tablet by mouth daily at 12 noon.   12/14/2016  . ranitidine (ZANTAC) 150 MG tablet Take 1 tablet (150 mg total) by mouth 2 (two) times daily. 60 tablet 5 12/14/2016     Review of Systems   All systems reviewed and negative except as stated in HPI  Blood pressure 145/83, pulse 99, temperature 98.9 F (37.2 C), height 5\' 6"  (1.676 m), weight 234 lb (106.1 kg), last menstrual period 03/14/2016. General appearance: alert, cooperative and appears stated age Lungs: clear to auscultation bilaterally Heart: regular rate and  rhythm Abdomen: soft, non-tender; bowel sounds normal Extremities: No calf swelling or tenderness Presentation: cephalic by ultrasound Fetal monitoring: 140/mod var/reactive Uterine activity: irritability     Prenatal labs: ABO, Rh: B/Positive/-- (08/22 1349) Antibody: Negative (08/22 1349) Rubella: immune RPR: Non Reactive (12/15 1025)  HBsAg: Negative (08/22 1349)  HIV: Non Reactive (12/15 1025)  GBS: Negative (02/13 1529)  1 hr Glucola: normal Genetic screening:  Normal 1st trimester screen Anatomy US: normal  Prenatal Transfer Tool  Maternal Diabetes: No Genetic Screening: Normal Maternal Ultrasounds/Referrals: Normal Fetal Ultrasounds or other Referrals:  None Maternal Substance Abuse:  No Significant Maternal Medications:  None Significant Maternal Lab Results: Lab values include: Group B Strep negative  No results found for this or any previous visit (from the past 24 hour(s)).  Patient Active Problem List   Diagnosis Date Noted  . Supervision of normal first pregnancy, antepartum 05/30/2016    Assessment: Maureen [name] is a 26 y.o. G1P0 at 4761w2d here for  SROm at 11:00 AM  #Labor:expectatn management at this time. Admti to labor no augemnetation until at least 8 hours ruptures. 2cm in office yesterday #Pain: Iv meds then epdirual #FWB: Category 1 #ID:  GBS neg #MOF: breast #MOC:unsure #Circ:  N/A  Maureen [name] 12/14/2016, 1:55 PM

## 2016-12-14 NOTE — Progress Notes (Signed)
Comfortable w/ epidural, feeling contractions   BP (!) 112/59   Pulse 86   Temp 97.6 F (36.4 C) (Oral)   Resp 18   Ht 5\' 6"  (1.676 m)   Wt 234 lb (106.1 kg)   LMP 03/14/2016 (Exact Date)   BMI 37.77 kg/m  SVE 7/100/+1 Fetal monitoring: 125 / mod variab / + acels / no decels  A progressing  p expectant  Adair LaundryAna Carvalho do [place], MD PGY1

## 2016-12-14 NOTE — Anesthesia Procedure Notes (Signed)
Epidural Patient location during procedure: OB Start time: 12/14/2016 4:24 PM End time: 12/14/2016 4:27 PM  Staffing Anesthesiologist: Leilani AbleHATCHETT, Aria Pickrell Performed: anesthesiologist   Preanesthetic Checklist Completed: patient identified, surgical consent, pre-op evaluation, timeout performed, IV checked, risks and benefits discussed and monitors and equipment checked  Epidural Patient position: sitting Prep: site prepped and draped and DuraPrep Patient monitoring: continuous pulse ox and blood pressure Approach: midline Location: L3-L4 Injection technique: LOR air  Needle:  Needle type: Tuohy  Needle gauge: 17 G Needle length: 9 cm and 9 Needle insertion depth: 6 cm Catheter type: closed end flexible Catheter size: 19 Gauge Catheter at skin depth: 11 cm Test dose: negative and Other  Assessment Sensory level: T9 Events: blood not aspirated, injection not painful, no injection resistance, negative IV test and no paresthesia  Additional Notes Reason for block:procedure for pain

## 2016-12-14 NOTE — MAU Note (Signed)
Patient presents with labor and water broke had large gush this morning, strong period cramps every 3 to 5 minutes.

## 2016-12-14 NOTE — Anesthesia Preprocedure Evaluation (Signed)
Anesthesia Evaluation  Patient identified by MRN, date of birth, ID band Patient awake    Reviewed: Allergy & Precautions, H&P , NPO status , Patient's Chart, lab work & pertinent test results  Airway Mallampati: II  TM Distance: >3 FB Neck ROM: full    Dental no notable dental hx.    Pulmonary neg pulmonary ROS,    Pulmonary exam normal        Cardiovascular negative cardio ROS Normal cardiovascular exam     Neuro/Psych negative neurological ROS  negative psych ROS   GI/Hepatic negative GI ROS, Neg liver ROS,   Endo/Other  negative endocrine ROS  Renal/GU negative Renal ROS     Musculoskeletal negative musculoskeletal ROS (+)   Abdominal (+) + obese,   Peds  Hematology negative hematology ROS (+)   Anesthesia Other Findings   Reproductive/Obstetrics (+) Pregnancy                             Anesthesia Physical Anesthesia Plan  ASA: II  Anesthesia Plan: Epidural   Post-op Pain Management:    Induction:   Airway Management Planned:   Additional Equipment:   Intra-op Plan:   Post-operative Plan:   Informed Consent: I have reviewed the patients History and Physical, chart, labs and discussed the procedure including the risks, benefits and alternatives for the proposed anesthesia with the patient or authorized representative who has indicated his/her understanding and acceptance.     Plan Discussed with:   Anesthesia Plan Comments:         Anesthesia Quick Evaluation

## 2016-12-15 LAB — RPR: RPR Ser Ql: NONREACTIVE

## 2016-12-15 NOTE — Anesthesia Postprocedure Evaluation (Signed)
Anesthesia Post Note  Patient: Maureen [name]  Procedure(s) Performed: * No procedures listed *  Patient location during evaluation: Mother Baby Anesthesia Type: Epidural Level of consciousness: awake and alert, oriented and patient cooperative Pain management: pain level controlled Vital Signs Assessment: post-procedure vital signs reviewed and stable Respiratory status: spontaneous breathing Cardiovascular status: stable Postop Assessment: no headache, epidural receding, patient able to bend at knees and no signs of nausea or vomiting Anesthetic complications: no Comments: Pain score 2.        Last Vitals:  Vitals:   12/14/16 2343 12/15/16 0300  BP: 133/79 137/69  Pulse: 100 89  Resp: 16   Temp: 36.8 C 36.9 C    Last Pain:  Vitals:   12/15/16 0300  TempSrc: Oral  PainSc: 0-No pain   Pain Goal: Patients Stated Pain Goal: 0 (12/14/16 1322)               Merrilyn PumaWRINKLE,Eavan Gonterman

## 2016-12-15 NOTE — Lactation Note (Signed)
This note was copied from a baby's chart. Lactation Consultation Note  Patient Name: Maureen [name]: 12/15/2016 Reason for consult: Initial assessment Baby at 20 hr of life. Mom is worried that baby has not been eating "every 3 hr" since birth. She denies breast or nipple pain. Stated when baby is awake and latches "she does great, but it is getting her to wake". Discussed baby behavior, feeding frequency, baby belly size, voids, wt loss, breast changes, and nipple care. Mom stated she can manually express and has spoon in room. Given lactation handouts. Aware of OP services and support group. Mom will offer the breast on demand, post express, and spoon feed per volume guidelines as needed. If baby does not start eating more often over the next 12-24hr she will start using DEBP. Lactation phone number on the white board for mom to call for latch help.     Maternal Data Has patient been taught Hand Expression?: Yes Does the patient have breastfeeding experience prior to this delivery?: No  Feeding Feeding Type: Breast Fed Length of feed: 0 min  LATCH Score/Interventions Latch: Too sleepy or reluctant, no latch achieved, no sucking elicited.                    Lactation Tools Discussed/Used WIC Program: No   Consult Status Consult Status: Follow-up [name]: 12/16/16 Follow-up type: In-patient    Rulon Eisenmengerlizabeth E Grabiel Schmutz 12/15/2016, 4:20 PM

## 2016-12-15 NOTE — Progress Notes (Signed)
PPD 1 SVD  S:  Reports feeling good, a little tired              Tolerating po/ No nausea or vomiting             Bleeding is moderate             Pain controlled with ibuprofen (OTC)             Up ad lib / ambulatory / voiding QS  Newborn breast feeding   O:               VS: BP 137/69 (BP Location: Right Arm)   Pulse 89   Temp 98.4 F (36.9 C) (Oral)   Resp 16   Ht 5\' 6"  (1.676 m)   Wt 106.1 kg (234 lb)   LMP 03/14/2016 (Exact Date)  BMI 37.77 kg/m    LABS:              Recent Labs  12/14/16 1425  WBC 9.9  HGB 10.8*  PLT 267               Blood type: --/--/B POS, B POS (03/09 1425)  Rubella: 2.82 (08/22 1349)                    Physical Exam:             Alert and oriented X3  Abdomen: soft, non-tender, non-distended              Fundus: firm, non-tender, U/1  Perineum: laceration repaired, mild edema no bruising   Lochia: moderate   Extremities: no edema, no calf pain or tenderness    A: PPD # 1   Doing well - stable status  P: Routine post partum orders  DC home tomorrow   Steward DroneVeronica Rogers BSN, SNM 12/15/2016, 7:26 AM   CNM attestation Post Partum Day #1  [name] is a 26 y.o. G1P1001 s/p SVD.  Pt denies problems with ambulating, voiding or po intake. Pain is well controlled.  Plan for birth control is oral progesterone-only contraceptive.  Method of Feeding: breast  PE:  BP 137/69 (BP Location: Right Arm)   Pulse 89   Temp 98.4 F (36.9 C) (Oral)   Resp 16   Ht 5\' 6"  (1.676 m)   Wt 106.1 kg (234 lb)   LMP 03/14/2016 (Exact Date)   Breastfeeding? Unknown   BMI 37.77 kg/m  Fundus firm  Plan for discharge: 12/16/16  Cam HaiSHAW, KIMBERLY, CNM 9:31 AM  12/15/2016

## 2016-12-15 NOTE — Plan of Care (Signed)
Problem: Urinary Elimination: Goal: Ability to reestablish a normal urinary elimination pattern will improve Pt states was unable to hold urine when going to the bathroom.

## 2016-12-15 NOTE — Progress Notes (Signed)
I have seen this patient, reviewed the chart, and agree with the student's note.

## 2016-12-16 MED ORDER — NORETHINDRONE 0.35 MG PO TABS: 1.0000 | ORAL_TABLET | Freq: Every day | ORAL | 11 refills | Status: DC

## 2016-12-16 MED ORDER — IBUPROFEN 600 MG PO TABS: 600.0000 mg | ORAL_TABLET | Freq: Four times a day (QID) | ORAL | 0 refills | Status: DC

## 2016-12-16 NOTE — Discharge Instructions (Signed)

## 2016-12-16 NOTE — Discharge Summary (Signed)
Obstetric Discharge Summary Reason for Admission: [redacted] weeks EGA, SROM Prenatal Procedures: ultrasound Intrapartum Procedures: spontaneous vaginal delivery Postpartum Procedures: none Complications-Operative and Postpartum: 1st degree perineal laceration Hemoglobin  Date Value Ref Range Status  12/14/2016 10.8 (L) 12.0 - 15.0 g/dL Final   HCT  Date Value Ref Range Status  12/14/2016 32.8 (L) 36.0 - 46.0 % Final   Hematocrit  Date Value Ref Range Status  09/21/2016 31.2 (L) 34.0 - 46.6 % Final    Physical Exam:  General: alert Lochia: appropriate Uterine Fundus: firm and NT at U-1 DVT Evaluation: No evidence of DVT seen on physical exam.  Discharge Diagnoses: Term Pregnancy-delivered  Discharge Information: Date: 12/16/2016 Activity: pelvic rest Diet: routine Medications: PNV, Ibuprofen and micronor and zantac Condition: stable Instructions: refer to practice specific booklet Discharge to: home Follow-up Information    [facility] [place]. Schedule an appointment as soon as possible for a visit in 6 week(s).   Specialty:  Obstetrics and Gynecology Contact information: [address], [address] [place] [id number] [phone]          Newborn Data: Live born female  Birth Weight: 6 lb 5.4 oz (2875 g) APGAR: 9, 9  Home with mother.  Allie BossierMyra C Dan Dissinger 12/16/2016, 9:03 AM

## 2016-12-16 NOTE — Lactation Note (Signed)
This note was copied from a baby's chart. Lactation Consultation Note  Patient Name: Girl Ricard DillonJasmine Cea ZOXWR'UToday's Date: 12/16/2016 Reason for consult: Follow-up assessment Mom reports baby is nursing well, denies questions/concerns. Denies pain with BF. LC offered to observe latch before d/c home, Mom declined. Advised baby should be at breast 8-12 times in 24 hours and with feeding ques. Keep baby nursing for 15-30 minutes both breasts some feedings. Engorgement care reviewed if needed, advised of OP services and support group.   Maternal Data    Feeding Feeding Type: Breast Fed Length of feed: 50 min  LATCH Score/Interventions Latch: Grasps breast easily, tongue down, lips flanged, rhythmical sucking.  Audible Swallowing: A few with stimulation Intervention(s): Hand expression;Skin to skin  Type of Nipple: Everted at rest and after stimulation  Comfort (Breast/Nipple): Soft / non-tender     Hold (Positioning): No assistance needed to correctly position infant at breast.  LATCH Score: 9  Lactation Tools Discussed/Used     Consult Status Consult Status: Complete Date: 12/16/16 Follow-up type: In-patient    Alfred LevinsGranger, Gianella Chismar Ann 12/16/2016, 10:45 AM

## 2016-12-20 ENCOUNTER — Encounter: Payer: Medicaid Other | Admitting: Certified Nurse Midwife

## 2017-01-25 ENCOUNTER — Ambulatory Visit: Payer: Medicaid Other | Admitting: Certified Nurse Midwife

## 2017-02-08 ENCOUNTER — Ambulatory Visit: Payer: Medicaid Other | Admitting: Certified Nurse Midwife

## 2017-10-08 NOTE — L&D Delivery Note (Addendum)
Patient: Maureen [name] MRN: [id number]  GBS status: Negative   Patient is a 27 y.o. now G2P2 s/p NSVD at 3162w2d, who was admitted for SOL. AROM 0h 8429m prior to delivery with clear fluid.    Delivery Note At 7:45 AM a viable female was delivered via Vaginal, Spontaneous (Presentation: ROA ).  APGAR: 7, 9; weight  pending.   Placenta status: spontaneous, intact.  Cord: 3 vessel with the following complications: nuchal x1.    Anesthesia:  Epidural  Episiotomy: None Lacerations: None Suture Repair: N/A Est. Blood Loss (mL):  51  Was called to delivery room for concern for prolonged decelerations to 60 bpm. Patient had a bulging bag, AROM performed with clear fluid. Cervix noted to be completely dilated. Mother started pushing with good maternal effort. Given prolonged periods of fetal bradycardia, Dr. Earlene Plateravis was called to room. NICU team was called to be at bedside for delivery. FHR continued to have good variability with periods of return to baseline. Patient pushed well with rapid descent of infant. Head delivered ROA. Nuchal cord present x1. Shoulder and body delivered in usual fashion with reduction of nuchal after delivery. Infant with spontaneous cry, placed on mother's abdomen, dried and bulb suctioned. Cord clamped x 2 after 1-minute delay, and cut by family member. NICU team not needed for evaluation. Cord blood drawn. Placenta delivered spontaneously with gentle cord traction. Fundus firm with massage and Pitocin. Perineum and vagina inspected and found to have no lacerations.  Mom to postpartum.  Baby to Couplet care / Skin to Skin.  De HollingsheadCatherine L Wallace 07/01/2018, 8:28 AM   Attestation of Attending Supervision of OB Fellow: Evaluation and management procedures were performed by the [facility] under my supervision and collaboration.  I have reviewed the OB Fellow's note and chart, and I agree with the management and plan.  Baldemar LenisK. Meryl Cherilyn Sautter, M.D. [facility]  07/02/2018 9:52 AM

## 2017-12-01 ENCOUNTER — Other Ambulatory Visit: Payer: Self-pay

## 2017-12-01 ENCOUNTER — Inpatient Hospital Stay (HOSPITAL_COMMUNITY): Payer: Medicaid Other

## 2017-12-01 ENCOUNTER — Inpatient Hospital Stay (HOSPITAL_COMMUNITY)
Admission: AD | Admit: 2017-12-01 | Discharge: 2017-12-01 | Disposition: A | Discharge status: Home / Self Care | Payer: Medicaid Other | Source: Ambulatory Visit | Attending: Obstetrics and Gynecology | Admitting: Obstetrics and Gynecology

## 2017-12-01 ENCOUNTER — Encounter (HOSPITAL_COMMUNITY): Payer: Self-pay | Admitting: *Deleted

## 2017-12-01 DIAGNOSIS — Z3A09 9 weeks gestation of pregnancy: Secondary | ICD-10-CM | POA: Diagnosis not present

## 2017-12-01 DIAGNOSIS — R58 Hemorrhage, not elsewhere classified: Secondary | ICD-10-CM

## 2017-12-01 DIAGNOSIS — O209 Hemorrhage in early pregnancy, unspecified: Secondary | ICD-10-CM | POA: Diagnosis not present

## 2017-12-01 DIAGNOSIS — O208 Other hemorrhage in early pregnancy: Secondary | ICD-10-CM

## 2017-12-01 DIAGNOSIS — Z3490 Encounter for supervision of normal pregnancy, unspecified, unspecified trimester: Secondary | ICD-10-CM

## 2017-12-01 LAB — URINALYSIS, ROUTINE W REFLEX MICROSCOPIC
Bacteria, UA: NONE SEEN
Bilirubin Urine: NEGATIVE
GLUCOSE, UA: NEGATIVE mg/dL
KETONES UR: 5 mg/dL — AB
Leukocytes, UA: NEGATIVE
NITRITE: NEGATIVE
PROTEIN: 30 mg/dL — AB
Specific Gravity, Urine: 1.028 (ref 1.005–1.030)
pH: 5 (ref 5.0–8.0)

## 2017-12-01 LAB — WET PREP, GENITAL
Clue Cells Wet Prep HPF POC: NONE SEEN
Sperm: NONE SEEN
TRICH WET PREP: NONE SEEN
WBC, Wet Prep HPF POC: NONE SEEN
YEAST WET PREP: NONE SEEN

## 2017-12-01 LAB — COMPREHENSIVE METABOLIC PANEL
ALK PHOS: 69 U/L (ref 38–126)
ALT: 8 U/L — ABNORMAL LOW (ref 14–54)
ANION GAP: 9 (ref 5–15)
AST: 12 U/L — ABNORMAL LOW (ref 15–41)
Albumin: 3.3 g/dL — ABNORMAL LOW (ref 3.5–5.0)
BUN: 6 mg/dL (ref 6–20)
CALCIUM: 8.9 mg/dL (ref 8.9–10.3)
CO2: 20 mmol/L — AB (ref 22–32)
Chloride: 104 mmol/L (ref 101–111)
Creatinine, Ser: 0.47 mg/dL (ref 0.44–1.00)
Glucose, Bld: 83 mg/dL (ref 65–99)
Potassium: 3.9 mmol/L (ref 3.5–5.1)
SODIUM: 133 mmol/L — AB (ref 135–145)
TOTAL PROTEIN: 6.7 g/dL (ref 6.5–8.1)
Total Bilirubin: 0.1 mg/dL — ABNORMAL LOW (ref 0.3–1.2)

## 2017-12-01 LAB — CBC
HCT: 36.2 % (ref 36.0–46.0)
Hemoglobin: 11.9 g/dL — ABNORMAL LOW (ref 12.0–15.0)
MCH: 27.2 pg (ref 26.0–34.0)
MCHC: 32.9 g/dL (ref 30.0–36.0)
MCV: 82.8 fL (ref 78.0–100.0)
Platelets: 319 10*3/uL (ref 150–400)
RBC: 4.37 MIL/uL (ref 3.87–5.11)
RDW: 14.3 % (ref 11.5–15.5)
WBC: 7.9 10*3/uL (ref 4.0–10.5)

## 2017-12-01 LAB — POCT PREGNANCY, URINE: Preg Test, Ur: POSITIVE — AB

## 2017-12-01 LAB — HCG, QUANTITATIVE, PREGNANCY: HCG, BETA CHAIN, QUANT, S: 62164 m[IU]/mL — AB (ref <5)

## 2017-12-01 NOTE — Discharge Instructions (Signed)
Subchorionic Hematoma °A subchorionic hematoma is a gathering of blood between the outer wall of the placenta and the inner wall of the womb (uterus). The placenta is the organ that connects the fetus to the wall of the uterus. The placenta performs the feeding, breathing (oxygen to the fetus), and waste removal (excretory work) of the fetus. °Subchorionic hematoma is the most common abnormality found on a result from ultrasonography done during the first trimester or early second trimester of pregnancy. If there has been little or no vaginal bleeding, early small hematomas usually shrink on their own and do not affect your baby or pregnancy. The blood is gradually absorbed over 1-2 weeks. When bleeding starts later in pregnancy or the hematoma is larger or occurs in an older pregnant woman, the outcome may not be as good. Larger hematomas may get bigger, which increases the chances for miscarriage. Subchorionic hematoma also increases the risk of premature detachment of the placenta from the uterus, preterm (premature) labor, and stillbirth. °Follow these instructions at home: °· Stay on bed rest if your health care provider recommends this. Although bed rest will not prevent more bleeding or prevent a miscarriage, your health care provider may recommend bed rest until you are advised otherwise. °· Avoid heavy lifting (more than 10 lb [4.5 kg]), exercise, sexual intercourse, or douching as directed by your health care provider. °· Keep track of the number of pads you use each day and how soaked (saturated) they are. Write down this information. °· Do not use tampons. °· Keep all follow-up appointments as directed by your health care provider. Your health care provider may ask you to have follow-up blood tests or ultrasound tests or both. °Get help right away if: °· You have severe cramps in your stomach, back, abdomen, or pelvis. °· You have a fever. °· You pass large clots or tissue. Save any tissue for your  health care provider to look at. °· Your bleeding increases or you become lightheaded, feel weak, or have fainting episodes. °This information is not intended to replace advice given to you by your health care provider. Make sure you discuss any questions you have with your health care provider. °Document Released: 01/09/2007 Document Revised: 03/01/2016 Document Reviewed: 04/23/2013 °Elsevier Interactive Patient Education © 2017 [employer] ° °

## 2017-12-01 NOTE — MAU Provider Note (Addendum)
Patient Maureen [name] is a 27 y.o. G1P1001 at 8 weeks based on LMP (September 26, 2017).  She is here with complaints of vaginal bleeding that started this morning. She denies abdominal pain, dysuria, abdominal pain, severe nausea and vomiting.   She denies history of miscarriage or ectopic pregnancy.  History     CSN: [id number]  Arrival date and time: 12/01/17 1604   None     Chief Complaint  Patient presents with  . Vaginal Bleeding   Vaginal Bleeding  The patient's primary symptoms include vaginal bleeding. The patient's pertinent negatives include no genital itching. This is a new problem. The current episode started today. Episode frequency: constant dark red bleeding that is light in flow.  The patient is experiencing no pain. Pertinent negatives include no back pain, chills, constipation, diarrhea or urgency. The vaginal discharge was bloody. The vaginal bleeding is lighter than menses. She has not been passing clots. Nothing aggravates the symptoms. She has tried nothing for the symptoms.    OB History    Gravida Para Term Preterm AB Living   1 1 1     1    SAB TAB Ectopic Multiple Live Births         0 1      Past Medical History:  Diagnosis Date  . Medical history non-contributory     Past Surgical History:  Procedure Laterality Date  . NO PAST SURGERIES      Family History  Problem Relation Age of Onset  . Diabetes Maternal Uncle   . Hypertension Maternal Grandmother   . Birth defects Neg Hx     Social History   Tobacco Use  . Smoking status: Never Smoker  . Smokeless tobacco: Never Used  Substance Use Topics  . Alcohol use: No  . Drug use: No    Allergies: No Known Allergies  Medications Prior to Admission  Medication Sig Dispense Refill Last Dose  . Prenatal Vit-Fe Fumarate-FA (PRENATAL MULTIVITAMIN) TABS tablet Take 1 tablet by mouth daily at 12 noon.   Past Week at Unknown time    Review of Systems  Constitutional: Negative for chills.   Gastrointestinal: Negative for constipation and diarrhea.  Genitourinary: Positive for vaginal bleeding. Negative for urgency.  Musculoskeletal: Negative for back pain.   Physical Exam   Blood pressure (!) 147/71, pulse 90, temperature 98.4 F (36.9 C), temperature source Oral, resp. rate 18, height 5\' 6"  (1.676 m), weight 226 lb 12 oz (102.9 kg), last menstrual period 09/26/2017, SpO2 100 %, unknown if currently breastfeeding.  Physical Exam  Constitutional: She is oriented to person, place, and time. She appears well-developed.  HENT:  Head: Normocephalic.  Neck: Normal range of motion.  Respiratory: Effort normal.  GI: Soft.  Genitourinary:  Genitourinary Comments: Normal external female genitalia; small amount of  dark red blood in the vagina; no tissue or clots. No CMT, suprapubic or adnexal tenderness.   Musculoskeletal: Normal range of motion.  Neurological: She is alert and oriented to person, place, and time.  Skin: Skin is warm and dry.    MAU Course  Procedures  MDM -US shows IUP and small subchorionic hemorrhage.  -CBC, ABO, beta hcg.  US imaging results independently reviewed by me.   Assessment and Plan   1. Intrauterine pregnancy   2. Bleeding   3. Vaginal bleeding affecting early pregnancy    2. Patient stable for discharge with bleeding precautions; warning signs reviewed.  3. Patient will call [facility] to make initial prenatal  visit.  Charlesetta Garibaldi Vara Mairena 12/01/2017, 4:48 PM

## 2017-12-01 NOTE — Progress Notes (Signed)
Wetprep and GC ordered dt MD at delivery with another pt.

## 2017-12-01 NOTE — MAU Note (Signed)
Pt presents with c/o bright red VB that began this morning.  Reports bleeding was light initially, but has "picked up more". States wearing sanitary napkin, changed twice since VB started. Denies passing clots.  Reports no abdominal cramping.  LMP  09/26/17, reports +UPT approximately 2-3 weeks ago.

## 2017-12-02 LAB — GC/CHLAMYDIA PROBE AMP (~~LOC~~) NOT AT ARMC
Chlamydia: NEGATIVE
NEISSERIA GONORRHEA: NEGATIVE

## 2017-12-19 ENCOUNTER — Encounter: Payer: Medicaid Other | Admitting: Certified Nurse Midwife

## 2018-01-30 ENCOUNTER — Other Ambulatory Visit (HOSPITAL_COMMUNITY): Payer: Self-pay | Admitting: Obstetrics and Gynecology

## 2018-01-30 DIAGNOSIS — Z363 Encounter for antenatal screening for malformations: Secondary | ICD-10-CM

## 2018-01-30 DIAGNOSIS — Z3A19 19 weeks gestation of pregnancy: Secondary | ICD-10-CM

## 2018-02-10 ENCOUNTER — Ambulatory Visit (HOSPITAL_COMMUNITY): Payer: Medicaid Other

## 2018-02-22 IMAGING — US US MFM FETAL NUCHAL TRANSLUCENCY
1 series · 15 of 28 positions shown · non-contrast
Comparison: none

[Series 1: us mfm fetal nuchal translucency · 33 acquisitions, 15 frames shown]
[im 1/33]
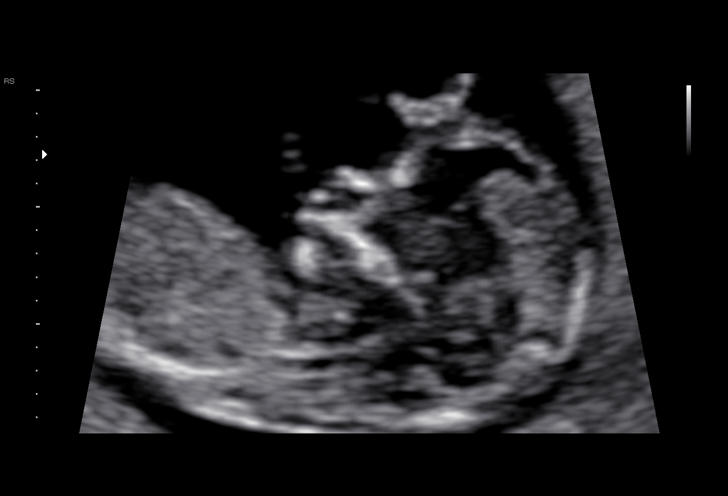
[im 3/33]
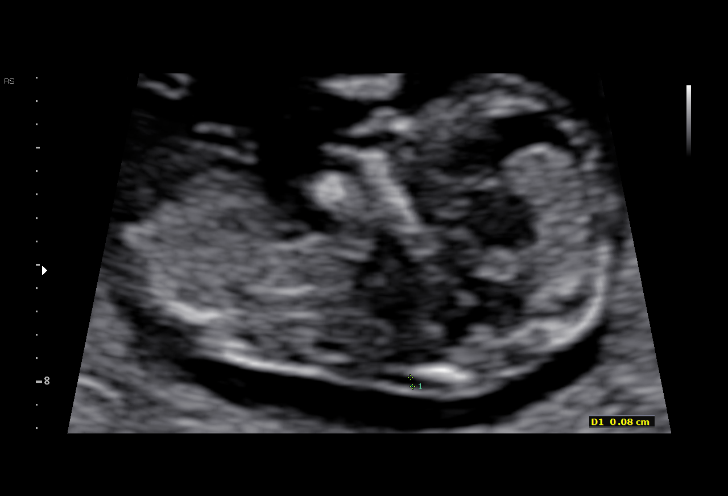
[im 5/33]
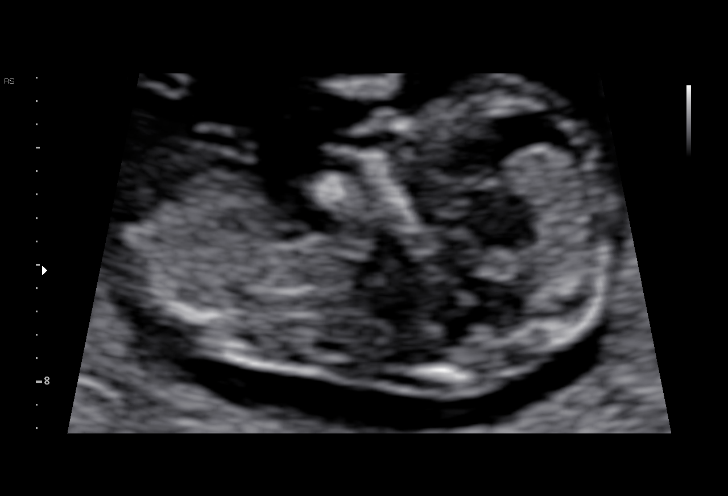
[im 8/33]
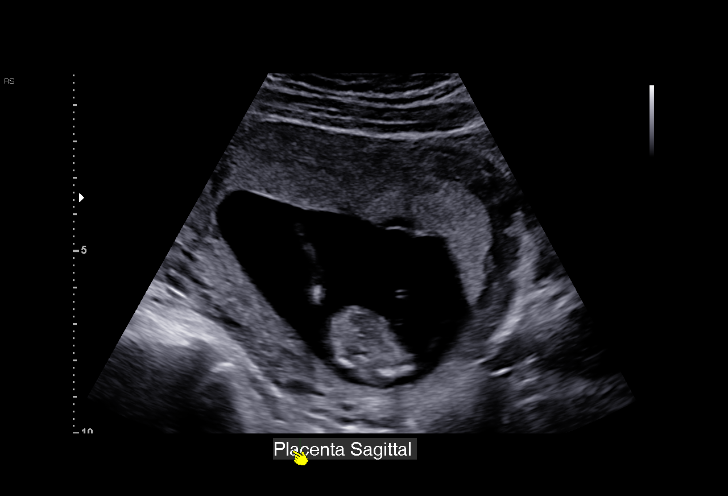
[im 10/33]
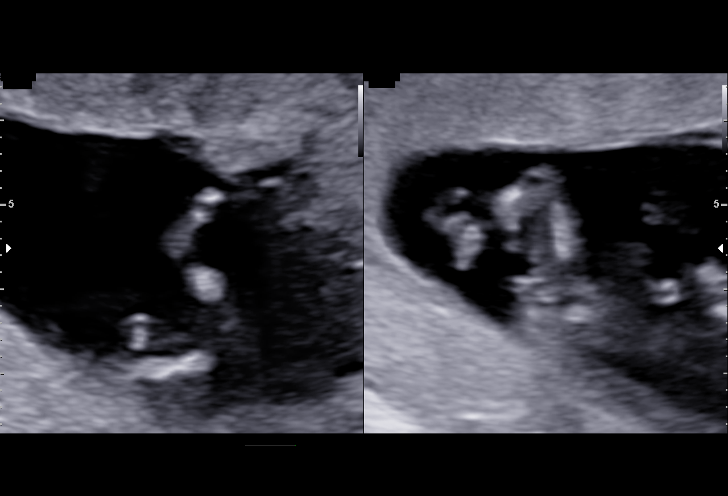
[im 12/33]
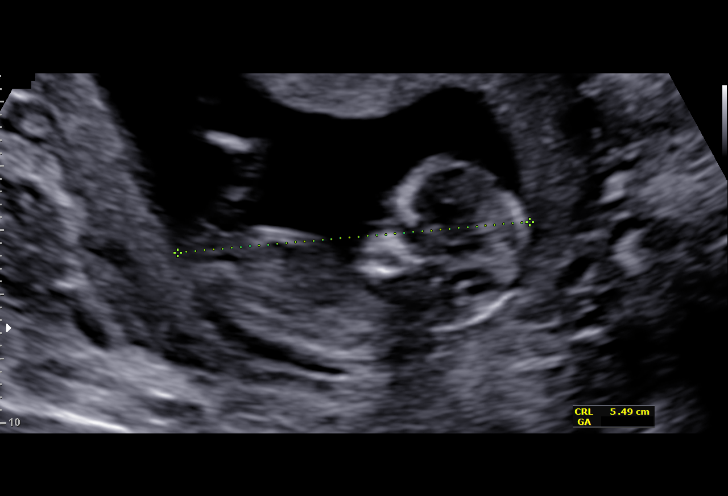
[im 15/33]
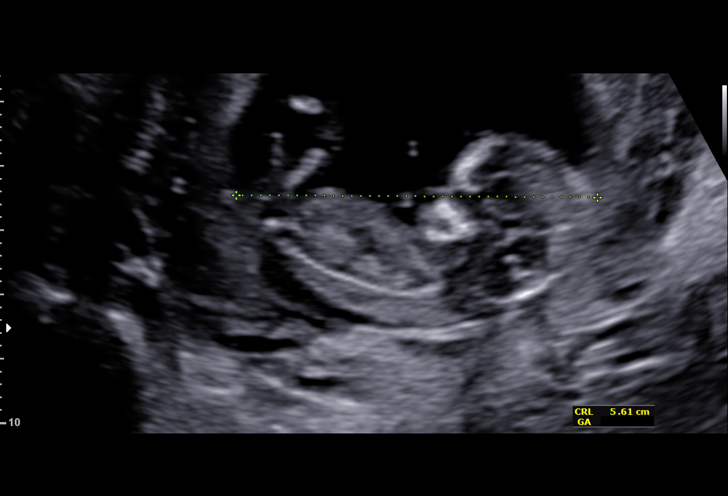
[im 17/33]
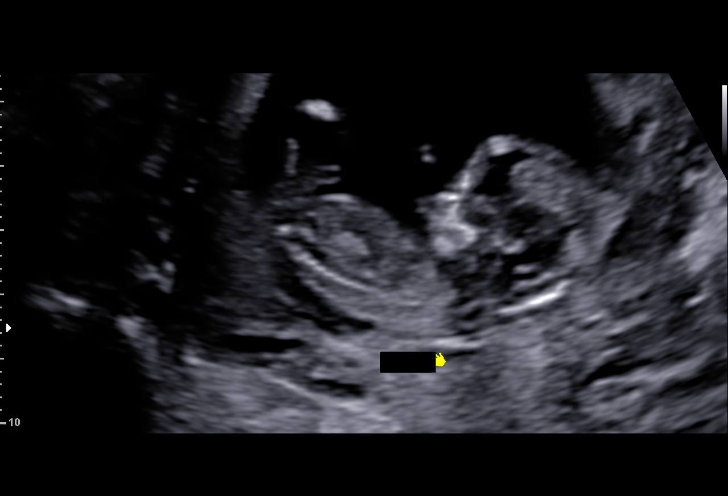
[im 18/33]
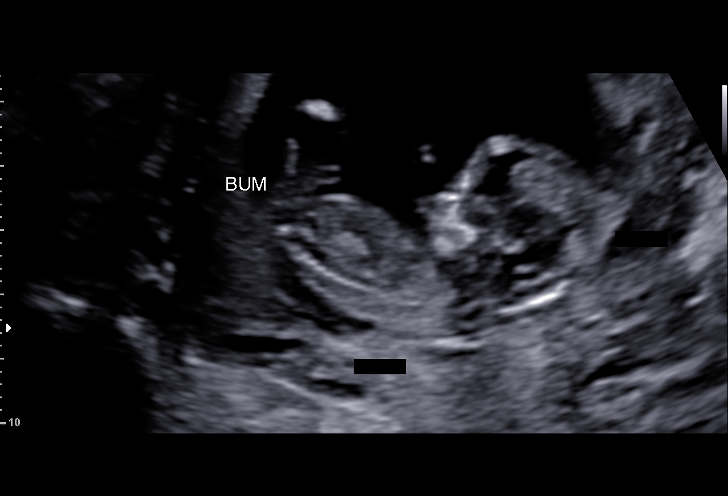
[im 21/33]
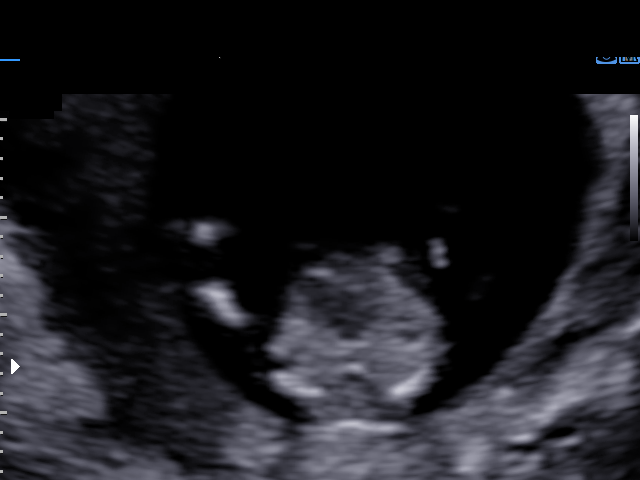
[im 23/33]
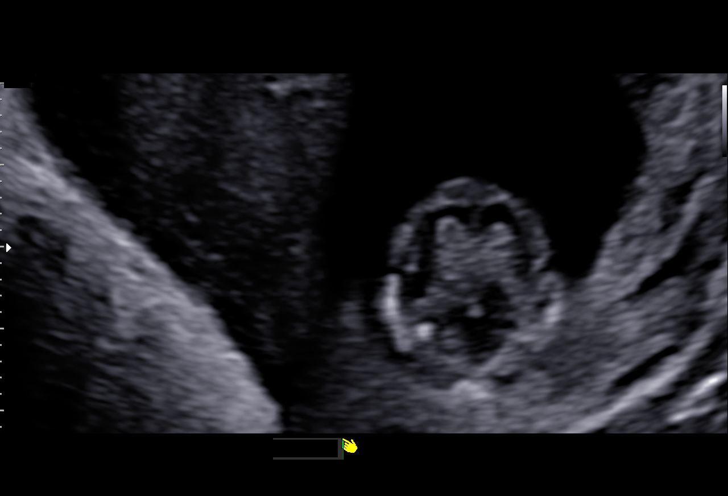
[im 25/33]
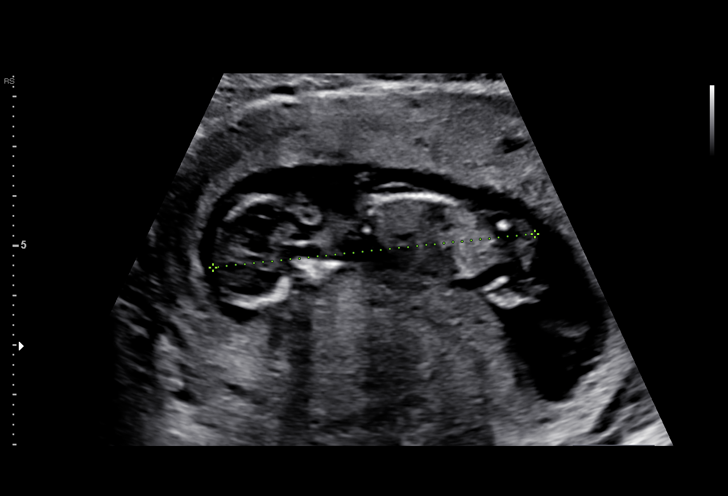
[im 28/33]
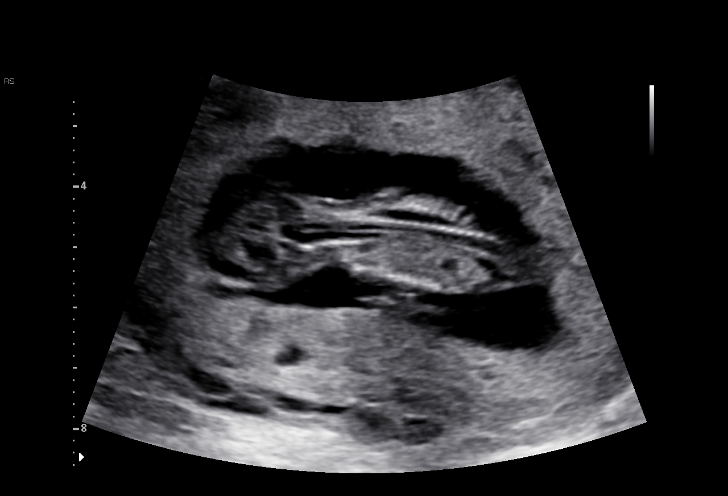
[im 30/33]
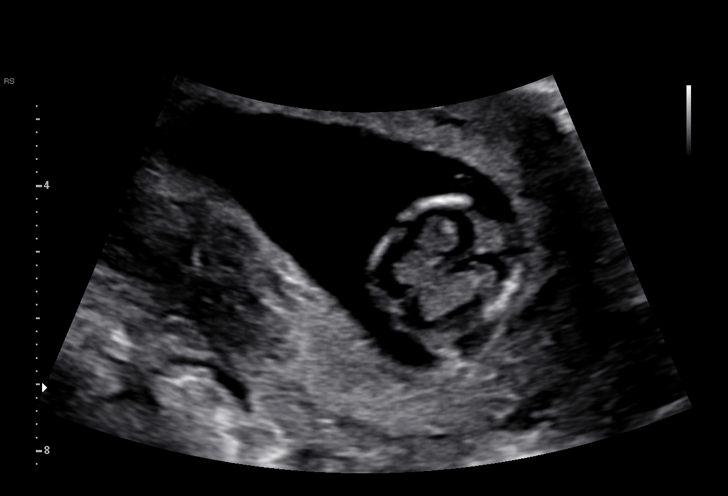
[im 33/33]
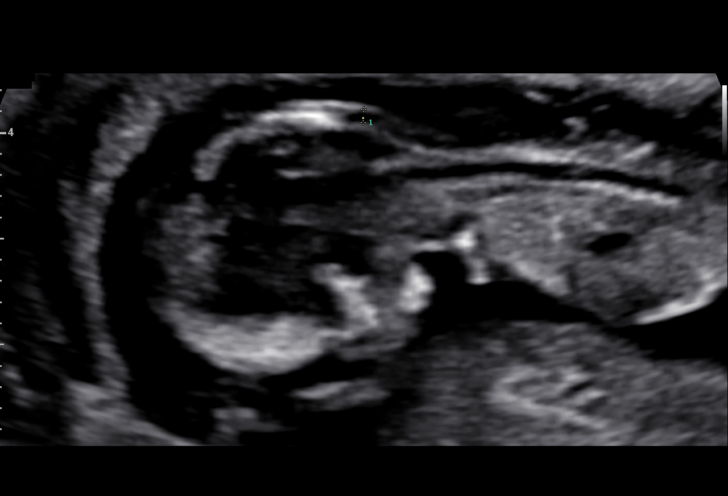

[15 of 28 positions shown; findings below may reference images not displayed]

TRANSLUCENCY

1  ROGE BYNUM             [id number]      [id number]     [id number]
Indications

13 weeks gestation of pregnancy
First trimester aneuploidy screen (NT)         Z36
OB History

Gravidity:    1
Fetal Evaluation

Num Of Fetuses:     1
Fetal Heart         151
Rate(bpm):
Cardiac Activity:   Observed
Presentation:       Variable
Placenta:           Anterior
Gestational Age

LMP:           13w 1d        Date:  03/14/16                 EDD:    12/19/16
Best:          13w 1d     Det. By:  LMP  (03/14/16)          EDD:    12/19/16
1st Trimester Genetic Sonogram Screening

CRL:              65  mm    G. Age:   12w 5d                 EDD:    12/22/16
Nuc Trans:       1.2  mm

Nasal Bone:                 Present
Anatomy
Stomach:               Appears normal         Upper Extremities:      Visualized
Bladder:               Appears normal         Lower Extremities:      Visualized
Impression

SIUP at 13+1 weeks
No gross abnormalities identified
NT measurement was within normal limits for this GA; NB
present
Normal amniotic fluid volume
Measurements consistent with LMP dating
Recommendations

Offer MSAFP in the second trimester for ONTD screening
Offer anatomy U/S by 18 weeks

## 2018-02-24 ENCOUNTER — Ambulatory Visit (HOSPITAL_COMMUNITY): Payer: Medicaid Other | Attending: Obstetrics and Gynecology

## 2018-02-24 ENCOUNTER — Encounter (HOSPITAL_COMMUNITY): Payer: Self-pay

## 2018-06-30 LAB — OB RESULTS CONSOLE GBS: GBS: NEGATIVE

## 2018-07-01 ENCOUNTER — Inpatient Hospital Stay (HOSPITAL_COMMUNITY)
Admission: AD | Admit: 2018-07-01 | Discharge: 2018-07-02 | DRG: 807 | Disposition: A | Discharge status: Home / Self Care | Payer: BLUE CROSS/BLUE SHIELD | Attending: Obstetrics and Gynecology | Admitting: Obstetrics and Gynecology

## 2018-07-01 ENCOUNTER — Inpatient Hospital Stay (HOSPITAL_COMMUNITY): Payer: BLUE CROSS/BLUE SHIELD | Admitting: Anesthesiology

## 2018-07-01 ENCOUNTER — Encounter (HOSPITAL_COMMUNITY): Payer: Self-pay | Admitting: *Deleted

## 2018-07-01 ENCOUNTER — Other Ambulatory Visit: Payer: Self-pay

## 2018-07-01 DIAGNOSIS — Z3483 Encounter for supervision of other normal pregnancy, third trimester: Secondary | ICD-10-CM | POA: Diagnosis present

## 2018-07-01 DIAGNOSIS — Z3A39 39 weeks gestation of pregnancy: Secondary | ICD-10-CM

## 2018-07-01 LAB — TYPE AND SCREEN
ABO/RH(D): B POS
Antibody Screen: NEGATIVE

## 2018-07-01 LAB — URINALYSIS, ROUTINE W REFLEX MICROSCOPIC
Bilirubin Urine: NEGATIVE
Glucose, UA: NEGATIVE mg/dL
Ketones, ur: NEGATIVE mg/dL
Leukocytes, UA: NEGATIVE
Nitrite: NEGATIVE
PROTEIN: NEGATIVE mg/dL
Specific Gravity, Urine: 1.023 (ref 1.005–1.030)
pH: 5 (ref 5.0–8.0)

## 2018-07-01 LAB — RAPID URINE DRUG SCREEN, HOSP PERFORMED
Amphetamines: NOT DETECTED
BARBITURATES: NOT DETECTED
BENZODIAZEPINES: NOT DETECTED
Cocaine: NOT DETECTED
Opiates: NOT DETECTED
Tetrahydrocannabinol: NOT DETECTED

## 2018-07-01 LAB — RAPID HIV SCREEN (HIV 1/2 AB+AG)
HIV 1/2 ANTIBODIES: NONREACTIVE
HIV-1 P24 ANTIGEN - HIV24: NONREACTIVE

## 2018-07-01 LAB — CBC
HEMATOCRIT: 31.7 % — AB (ref 36.0–46.0)
HEMOGLOBIN: 10.2 g/dL — AB (ref 12.0–15.0)
MCH: 25.6 pg — ABNORMAL LOW (ref 26.0–34.0)
MCHC: 32.2 g/dL (ref 30.0–36.0)
MCV: 79.4 fL (ref 78.0–100.0)
PLATELETS: 318 10*3/uL (ref 150–400)
RBC: 3.99 MIL/uL (ref 3.87–5.11)
RDW: 14.5 % (ref 11.5–15.5)
WBC: 8 10*3/uL (ref 4.0–10.5)

## 2018-07-01 LAB — HEPATITIS B SURFACE ANTIGEN: Hepatitis B Surface Ag: NEGATIVE

## 2018-07-01 LAB — GROUP B STREP BY PCR: GROUP B STREP BY PCR: NEGATIVE

## 2018-07-01 LAB — HEMOGLOBIN A1C
HEMOGLOBIN A1C: 5.8 % — AB (ref 4.8–5.6)
Mean Plasma Glucose: 119.76 mg/dL

## 2018-07-01 LAB — RPR: RPR: NONREACTIVE

## 2018-07-01 MED ORDER — ACETAMINOPHEN 325 MG PO TABS: 650.0000 mg | ORAL_TABLET | ORAL | Status: DC | PRN

## 2018-07-01 MED ORDER — IBUPROFEN 600 MG PO TABS
600.0000 mg | ORAL_TABLET | Freq: Four times a day (QID) | ORAL | Status: DC
  Administered 2018-07-01 – 2018-07-02 (×5): 600 mg via ORAL
  Filled 2018-07-01 (×6): qty 1

## 2018-07-01 MED ORDER — PHENYLEPHRINE 40 MCG/ML (10ML) SYRINGE FOR IV PUSH (FOR BLOOD PRESSURE SUPPORT)
80.0000 ug | PREFILLED_SYRINGE | INTRAVENOUS | Status: DC | PRN
  Filled 2018-07-01: qty 5
  Filled 2018-07-01: qty 10

## 2018-07-01 MED ORDER — SIMETHICONE 80 MG PO CHEW: 80.0000 mg | CHEWABLE_TABLET | ORAL | Status: DC | PRN

## 2018-07-01 MED ORDER — OXYTOCIN 40 UNITS IN LACTATED RINGERS INFUSION - SIMPLE MED
2.5000 [IU]/h | INTRAVENOUS | Status: DC
  Filled 2018-07-01: qty 1000

## 2018-07-01 MED ORDER — BENZOCAINE-MENTHOL 20-0.5 % EX AERO: 1.0000 "application " | INHALATION_SPRAY | CUTANEOUS | Status: DC | PRN

## 2018-07-01 MED ORDER — TETANUS-DIPHTH-ACELL PERTUSSIS 5-2.5-18.5 LF-MCG/0.5 IM SUSP: 0.5000 mL | Freq: Once | INTRAMUSCULAR | Status: DC

## 2018-07-01 MED ORDER — DIPHENHYDRAMINE HCL 25 MG PO CAPS: 25.0000 mg | ORAL_CAPSULE | Freq: Four times a day (QID) | ORAL | Status: DC | PRN

## 2018-07-01 MED ORDER — ACETAMINOPHEN 325 MG PO TABS
650.0000 mg | ORAL_TABLET | ORAL | Status: DC | PRN
  Filled 2018-07-01: qty 2

## 2018-07-01 MED ORDER — ONDANSETRON HCL 4 MG/2ML IJ SOLN: 4.0000 mg | INTRAMUSCULAR | Status: DC | PRN

## 2018-07-01 MED ORDER — PRENATAL MULTIVITAMIN CH
1.0000 | ORAL_TABLET | Freq: Every day | ORAL | Status: DC
  Administered 2018-07-01 – 2018-07-02 (×2): 1 via ORAL
  Filled 2018-07-01 (×3): qty 1

## 2018-07-01 MED ORDER — OXYTOCIN BOLUS FROM INFUSION
500.0000 mL | Freq: Once | INTRAVENOUS | Status: AC
  Administered 2018-07-01: 500 mL via INTRAVENOUS

## 2018-07-01 MED ORDER — WITCH HAZEL-GLYCERIN EX PADS: 1.0000 "application " | MEDICATED_PAD | CUTANEOUS | Status: DC | PRN

## 2018-07-01 MED ORDER — FENTANYL 2.5 MCG/ML BUPIVACAINE 1/10 % EPIDURAL INFUSION (WH - ANES)
14.0000 mL/h | INTRAMUSCULAR | Status: DC | PRN
  Administered 2018-07-01: 14 mL/h via EPIDURAL
  Filled 2018-07-01: qty 100

## 2018-07-01 MED ORDER — FLEET ENEMA 7-19 GM/118ML RE ENEM: 1.0000 | ENEMA | RECTAL | Status: DC | PRN

## 2018-07-01 MED ORDER — SOD CITRATE-CITRIC ACID 500-334 MG/5ML PO SOLN: 30.0000 mL | ORAL | Status: DC | PRN

## 2018-07-01 MED ORDER — OXYCODONE-ACETAMINOPHEN 5-325 MG PO TABS: 2.0000 | ORAL_TABLET | ORAL | Status: DC | PRN

## 2018-07-01 MED ORDER — ONDANSETRON HCL 4 MG PO TABS: 4.0000 mg | ORAL_TABLET | ORAL | Status: DC | PRN

## 2018-07-01 MED ORDER — LIDOCAINE HCL (PF) 1 % IJ SOLN
30.0000 mL | INTRAMUSCULAR | Status: DC | PRN
  Filled 2018-07-01: qty 30

## 2018-07-01 MED ORDER — LACTATED RINGERS IV SOLN
INTRAVENOUS | Status: DC
  Administered 2018-07-01: 05:00:00 via INTRAVENOUS

## 2018-07-01 MED ORDER — SENNOSIDES-DOCUSATE SODIUM 8.6-50 MG PO TABS
2.0000 | ORAL_TABLET | ORAL | Status: DC
  Administered 2018-07-01: 2 via ORAL
  Filled 2018-07-01: qty 2

## 2018-07-01 MED ORDER — ONDANSETRON HCL 4 MG/2ML IJ SOLN: 4.0000 mg | Freq: Four times a day (QID) | INTRAMUSCULAR | Status: DC | PRN

## 2018-07-01 MED ORDER — DIPHENHYDRAMINE HCL 50 MG/ML IJ SOLN: 12.5000 mg | INTRAMUSCULAR | Status: DC | PRN

## 2018-07-01 MED ORDER — INFLUENZA VAC SPLIT QUAD 0.5 ML IM SUSY: 0.5000 mL | PREFILLED_SYRINGE | INTRAMUSCULAR | Status: DC

## 2018-07-01 MED ORDER — OXYCODONE-ACETAMINOPHEN 5-325 MG PO TABS: 1.0000 | ORAL_TABLET | ORAL | Status: DC | PRN

## 2018-07-01 MED ORDER — ZOLPIDEM TARTRATE 5 MG PO TABS: 5.0000 mg | ORAL_TABLET | Freq: Every evening | ORAL | Status: DC | PRN

## 2018-07-01 MED ORDER — LACTATED RINGERS IV SOLN: 500.0000 mL | INTRAVENOUS | Status: DC | PRN

## 2018-07-01 MED ORDER — EPHEDRINE 5 MG/ML INJ
10.0000 mg | INTRAVENOUS | Status: DC | PRN
  Filled 2018-07-01: qty 2

## 2018-07-01 MED ORDER — LACTATED RINGERS IV SOLN
500.0000 mL | Freq: Once | INTRAVENOUS | Status: AC
  Administered 2018-07-01: 500 mL via INTRAVENOUS

## 2018-07-01 MED ORDER — PHENYLEPHRINE 40 MCG/ML (10ML) SYRINGE FOR IV PUSH (FOR BLOOD PRESSURE SUPPORT)
80.0000 ug | PREFILLED_SYRINGE | INTRAVENOUS | Status: DC | PRN
  Filled 2018-07-01: qty 5

## 2018-07-01 MED ORDER — LIDOCAINE HCL (PF) 1 % IJ SOLN
INTRAMUSCULAR | Status: DC | PRN
  Administered 2018-07-01: 10 mL via EPIDURAL

## 2018-07-01 MED ORDER — DIBUCAINE 1 % RE OINT: 1.0000 "application " | TOPICAL_OINTMENT | RECTAL | Status: DC | PRN

## 2018-07-01 MED ORDER — COCONUT OIL OIL: 1.0000 "application " | TOPICAL_OIL | Status: DC | PRN

## 2018-07-01 NOTE — H&P (Signed)
LABOR AND DELIVERY ADMISSION HISTORY AND PHYSICAL NOTE  Maureen [name] is a 27 y.o. female G2P1001 with IUP at [redacted]w[redacted]d by 9 wk sono presenting for SOL. Reports ctx started at 10 pm on 9/23 and have become progressively stronger.  She reports positive fetal movement. She denies leakage of fluid or vaginal bleeding.  Prenatal History/Complications: PNC at: none. Patient reports receiving care at [facility] but no records available.  Pregnancy complications:  - No PNC   Past Medical History: Past Medical History:  Diagnosis Date  . Medical history non-contributory     Past Surgical History: Past Surgical History:  Procedure Laterality Date  . NO PAST SURGERIES      Obstetrical History: OB History    Gravida  2   Para  1   Term  1   Preterm      AB      Living  1     SAB      TAB      Ectopic      Multiple  0   Live Births  1           Social History: Social History   Socioeconomic History  . Marital status: Single    Spouse name: Not on file  . Number of children: Not on file  . Years of education: Not on file  . Highest education level: Not on file  Occupational History  . Not on file  Social Needs  . Financial resource strain: Not on file  . Food insecurity:    Worry: Not on file    Inability: Not on file  . Transportation needs:    Medical: Not on file    Non-medical: Not on file  Tobacco Use  . Smoking status: Never Smoker  . Smokeless tobacco: Never Used  Substance and Sexual Activity  . Alcohol use: No  . Drug use: No  . Sexual activity: Never  Lifestyle  . Physical activity:    Days per week: Not on file    Minutes per session: Not on file  . Stress: Not on file  Relationships  . Social connections:    Talks on phone: Not on file    Gets together: Not on file    Attends religious service: Not on file    Active member of club or organization: Not on file    Attends meetings of clubs or organizations: Not on file    Relationship  status: Not on file  Other Topics Concern  . Not on file  Social History Narrative  . Not on file    Family History: Family History  Problem Relation Age of Onset  . Diabetes Maternal Uncle   . Hypertension Maternal Grandmother   . Birth defects Neg Hx     Allergies: No Known Allergies  Medications Prior to Admission  Medication Sig Dispense Refill Last Dose  . Prenatal Vit-Fe Fumarate-FA (PRENATAL MULTIVITAMIN) TABS tablet Take 1 tablet by mouth daily at 12 noon.   06/30/2018 at Unknown time     Review of Systems  All systems reviewed and negative except as stated in HPI  Physical Exam Blood pressure 128/80, pulse 77, temperature 98 F (36.7 C), temperature source Oral, resp. rate 16, height 5\' 6"  (1.676 m), weight 104.8 kg, last menstrual period 09/26/2017, SpO2 100 %, unknown if currently breastfeeding. General appearance: alert, oriented, NAD Lungs: normal respiratory effort Heart: regular rate Abdomen: soft, non-tender; gravid, FH appropriate for GA Extremities: No calf swelling or  tenderness Presentation: cephalic Fetal monitoring: 125 bpm, mod variability, +acels, no decels  Uterine activity: q2-5 min  Dilation: 5 Effacement (%): 70 Station: -3 Exam by:: danielle simpson and lauren cox rn   Prenatal labs: ABO, Rh: --/--/B POS (09/24 [id number]) Antibody: PENDING (09/24 0456) Rubella:  Pending  RPR:   Pending  HBsAg:   Pending  HIV:   Pending  GC/Chlamydia: Pending  GBS:   PCR collected at admission with negative result  2-hr GTT: never collected. Will check A1c.  Genetic screening:  None.  Anatomy US: Order present in EMR, but no result available.   Prenatal Transfer Tool  Maternal Diabetes: No Genetic Screening: Declined Maternal Ultrasounds/Referrals: Normal Fetal Ultrasounds or other Referrals:  None Maternal Substance Abuse:  No Significant Maternal Medications:  None Significant Maternal Lab Results: Lab values include: Group B Strep  negative  Results for orders placed or performed during the hospital encounter of 07/01/18 (from the past 24 hour(s))  Group B strep by PCR   Collection Time: 07/01/18  4:25 AM  Result Value Ref Range   Group B strep by PCR NEGATIVE NEGATIVE  Urinalysis, Routine w reflex microscopic   Collection Time: 07/01/18  4:37 AM  Result Value Ref Range   Color, Urine YELLOW YELLOW   APPearance HAZY (A) CLEAR   Specific Gravity, Urine 1.023 1.005 - 1.030   pH 5.0 5.0 - 8.0   Glucose, UA NEGATIVE NEGATIVE mg/dL   Hgb urine dipstick LARGE (A) NEGATIVE   Bilirubin Urine NEGATIVE NEGATIVE   Ketones, ur NEGATIVE NEGATIVE mg/dL   Protein, ur NEGATIVE NEGATIVE mg/dL   Nitrite NEGATIVE NEGATIVE   Leukocytes, UA NEGATIVE NEGATIVE   RBC / HPF 21-50 0 - 5 RBC/hpf   WBC, UA 0-5 0 - 5 WBC/hpf   Bacteria, UA RARE (A) NONE SEEN   Squamous Epithelial / LPF 0-5 0 - 5   Mucus PRESENT   CBC   Collection Time: 07/01/18  4:56 AM  Result Value Ref Range   WBC 8.0 4.0 - 10.5 K/uL   RBC 3.99 3.87 - 5.11 MIL/uL   Hemoglobin 10.2 (L) 12.0 - 15.0 g/dL   HCT 96.031.7 (L) 45.436.0 - 09.846.0 %   MCV 79.4 78.0 - 100.0 fL   MCH 25.6 (L) 26.0 - 34.0 pg   MCHC 32.2 30.0 - 36.0 g/dL   RDW 11.914.5 14.711.5 - 82.915.5 %   Platelets 318 150 - 400 K/uL  Type and screen [facility]   Collection Time: 07/01/18  4:56 AM  Result Value Ref Range   ABO/RH(D) B POS    Antibody Screen PENDING    Sample Expiration      07/04/2018 Performed at [facility], [address] [place], [place] [address]     Patient Active Problem List   Diagnosis Date Noted  . Indication for care in labor or delivery 07/01/2018  . Normal labor 12/14/2016  . Supervision of normal first pregnancy, antepartum 05/30/2016    Assessment: Maureen [name] is a 27 y.o. G2P1001 at 4581w2d here for SOL. No PNC received per chart review.   #Labor: Expectant management.  #Pain: Epidural in place.  #FWB: Cat I.  #ID:  GBS neg by PCR obtained at  admission.  #MOF: Breast  #MOC:Unknown. Would like to discuss postpartum.  #Circ:  N/A  #Will obtain prenatal labs, HgB A1c, GC/Chlamydia, and UDS. Will need SW consult PP.   De HollingsheadCatherine L Atha Muradyan 07/01/2018, 6:27 AM

## 2018-07-01 NOTE — Anesthesia Preprocedure Evaluation (Signed)
Anesthesia Evaluation  Patient identified by MRN, date of birth, ID band Patient awake    Reviewed: Allergy & Precautions, H&P , NPO status , Patient's Chart, lab work & pertinent test results  History of Anesthesia Complications Negative for: history of anesthetic complications  Airway Mallampati: II  TM Distance: >3 FB Neck ROM: full    Dental no notable dental hx.    Pulmonary neg pulmonary ROS,    Pulmonary exam normal        Cardiovascular negative cardio ROS Normal cardiovascular exam Rhythm:regular Rate:Normal     Neuro/Psych negative neurological ROS     GI/Hepatic negative GI ROS, Neg liver ROS,   Endo/Other  negative endocrine ROS  Renal/GU negative Renal ROS  negative genitourinary   Musculoskeletal   Abdominal   Peds  Hematology negative hematology ROS (+)   Anesthesia Other Findings   Reproductive/Obstetrics (+) Pregnancy                             Anesthesia Physical Anesthesia Plan  ASA: II  Anesthesia Plan: Epidural   Post-op Pain Management:    Induction:   PONV Risk Score and Plan:   Airway Management Planned:   Additional Equipment:   Intra-op Plan:   Post-operative Plan:   Informed Consent: I have reviewed the patients History and Physical, chart, labs and discussed the procedure including the risks, benefits and alternatives for the proposed anesthesia with the patient or authorized representative who has indicated his/her understanding and acceptance.     Plan Discussed with:   Anesthesia Plan Comments:         Anesthesia Quick Evaluation  

## 2018-07-01 NOTE — MAU Note (Signed)
Pt reports contractions since 10pm now every 2-3 minutes for 3 hours. Pt denies LOF or vaginal bleeding. Reports good fetal movement. Pt reports she has not had PNC for several months due to loss of insurance.

## 2018-07-01 NOTE — Anesthesia Postprocedure Evaluation (Signed)
Anesthesia Post Note  Patient: Maureen [name]  Procedure(s) Performed: AN AD HOC LABOR EPIDURAL     Patient location during evaluation: Mother Baby Anesthesia Type: Epidural Level of consciousness: awake and alert Pain management: pain level controlled Vital Signs Assessment: post-procedure vital signs reviewed and stable Respiratory status: spontaneous breathing, nonlabored ventilation and respiratory function stable Cardiovascular status: stable Postop Assessment: no headache, no backache, epidural receding, no apparent nausea or vomiting, patient able to bend at knees, adequate PO intake and able to ambulate Anesthetic complications: no    Last Vitals:  Vitals:   07/01/18 1013 07/01/18 1120  BP: 133/90 133/69  Pulse: 65 67  Resp: 20 20  Temp: 36.6 C 36.8 C  SpO2:      Last Pain:  Vitals:   07/01/18 1230  TempSrc:   PainSc: 1    Pain Goal:                 Land O'LakesMalinova,Dianelly Ferran Hristova

## 2018-07-01 NOTE — Anesthesia Postprocedure Evaluation (Signed)
Anesthesia Post Note  Patient: Maureen [name]  Procedure(s) Performed: AN AD HOC LABOR EPIDURAL     Anesthesia Post Evaluation  Last Vitals:  Vitals:   07/01/18 0931 07/01/18 1013  BP: 124/73 133/90  Pulse: 69 65  Resp: 18 20  Temp:  36.6 C  SpO2:      Last Pain:  Vitals:   07/01/18 1013  TempSrc: Oral  PainSc:    Pain Goal:                 Paxton Kanaan

## 2018-07-01 NOTE — Anesthesia Procedure Notes (Signed)
Epidural Patient location during procedure: OB Start time: 07/01/2018 5:55 AM End time: 07/01/2018 6:11 AM  Staffing Anesthesiologist: Lucretia KernWitman, Ortha Metts E, MD Performed: anesthesiologist   Preanesthetic Checklist Completed: patient identified, pre-op evaluation, timeout performed, IV checked, risks and benefits discussed and monitors and equipment checked  Epidural Patient position: sitting Prep: DuraPrep Patient monitoring: heart rate, continuous pulse ox and blood pressure Approach: midline Injection technique: LOR saline  Needle:  Needle type: Tuohy  Needle gauge: 17 G Needle length: 9 cm Needle insertion depth: 7 cm Catheter type: closed end flexible Catheter size: 19 Gauge Catheter at skin depth: 12 cm  Assessment Events: blood not aspirated, injection not painful, no injection resistance, negative IV test and no paresthesia  Additional Notes Reason for block:procedure for pain

## 2018-07-02 LAB — GC/CHLAMYDIA PROBE AMP (~~LOC~~) NOT AT ARMC
CHLAMYDIA, DNA PROBE: NEGATIVE
NEISSERIA GONORRHEA: NEGATIVE

## 2018-07-02 LAB — RUBELLA ANTIBODY, IGM: Rubella IgM: <20 AU/mL (ref 0.0–19.9)

## 2018-07-02 MED ORDER — SENNOSIDES-DOCUSATE SODIUM 8.6-50 MG PO TABS: 2.0000 | ORAL_TABLET | ORAL | 0 refills | Status: DC

## 2018-07-02 MED ORDER — ACETAMINOPHEN 325 MG PO TABS: 650.0000 mg | ORAL_TABLET | ORAL | 0 refills | Status: AC | PRN

## 2018-07-02 MED ORDER — IBUPROFEN 600 MG PO TABS: 600.0000 mg | ORAL_TABLET | Freq: Four times a day (QID) | ORAL | 0 refills | Status: AC

## 2018-07-02 NOTE — Lactation Note (Signed)
This note was copied from a baby's chart. Lactation Consultation Note  Patient Name: Maureen [name] [id number] Date: 07/02/2018 Reason for consult: Initial assessment;Term Breastfeeding consultation services and support information given and reviewed.  Mom is experienced breastfeeding her first baby for 12 months.  She states newborn is latching easily and cluster feeding.  Reassured and instructed to continue to feed with cues.  Encouraged to call with concerns or assist prn.  Maternal Data Does the patient have breastfeeding experience prior to this delivery?: Yes  Feeding Feeding Type: Breast Fed Length of feed: 10 min  LATCH Score Latch: Grasps breast easily, tongue down, lips flanged, rhythmical sucking.  Audible Swallowing: A few with stimulation  Type of Nipple: Everted at rest and after stimulation  Comfort (Breast/Nipple): Soft / non-tender  Hold (Positioning): No assistance needed to correctly position infant at breast.  LATCH Score: 9  Interventions    Lactation Tools Discussed/Used     Consult Status Consult Status: Follow-up Date: 07/03/18 Follow-up type: In-patient    Huston Foley 07/02/2018, 1:22 PM

## 2018-07-02 NOTE — Discharge Instructions (Signed)
Vaginal Delivery, Care After °Refer to this sheet in the next few weeks. These instructions provide you with information about caring for yourself after vaginal delivery. Your health care provider may also give you more specific instructions. Your treatment has been planned according to current medical practices, but problems sometimes occur. Call your health care provider if you have any problems or questions. °What can I expect after the procedure? °After vaginal delivery, it is common to have: °· Some bleeding from your vagina. °· Soreness in your abdomen, your vagina, and the area of skin between your vaginal opening and your anus (perineum). °· Pelvic cramps. °· Fatigue. ° °Follow these instructions at home: °Medicines °· Take over-the-counter and prescription medicines only as told by your health care provider. °· If you were prescribed an antibiotic medicine, take it as told by your health care provider. Do not stop taking the antibiotic until it is finished. °Driving ° °· Do not drive or operate heavy machinery while taking prescription pain medicine. °· Do not drive for 24 hours if you received a sedative. °Lifestyle °· Do not drink alcohol. This is especially important if you are breastfeeding or taking medicine to relieve pain. °· Do not use tobacco products, including cigarettes, chewing tobacco, or e-cigarettes. If you need help quitting, ask your health care provider. °Eating and drinking °· Drink at least 8 eight-ounce glasses of water every day unless you are told not to by your health care provider. If you choose to breastfeed your baby, you may need to drink more water than this. °· Eat high-fiber foods every day. These foods may help prevent or relieve constipation. High-fiber foods include: °? Whole grain cereals and breads. °? Brown rice. °? Beans. °? Fresh fruits and vegetables. °Activity °· Return to your normal activities as told by your health care provider. Ask your health care provider  what activities are safe for you. °· Rest as much as possible. Try to rest or take a nap when your baby is sleeping. °· Do not lift anything that is heavier than your baby or 10 lb (4.5 kg) until your health care provider says that it is safe. °· Talk with your health care provider about when you can engage in sexual activity. This may depend on your: °? Risk of infection. °? Rate of healing. °? Comfort and desire to engage in sexual activity. °Vaginal Care °· If you have an episiotomy or a vaginal tear, check the area every day for signs of infection. Check for: °? More redness, swelling, or pain. °? More fluid or blood. °? Warmth. °? Pus or a bad smell. °· Do not use tampons or douches until your health care provider says this is safe. °· Watch for any blood clots that may pass from your vagina. These may look like clumps of dark red, brown, or black discharge. °General instructions °· Keep your perineum clean and dry as told by your health care provider. °· Wear loose, comfortable clothing. °· Wipe from front to back when you use the toilet. °· Ask your health care provider if you can shower or take a bath. If you had an episiotomy or a perineal tear during labor and delivery, your health care provider may tell you not to take baths for a certain length of time. °· Wear a bra that supports your breasts and fits you well. °· If possible, have someone help you with household activities and help care for your baby for at least a few days after   you leave the hospital. °· Keep all follow-up visits for you and your baby as told by your health care provider. This is important. °Contact a health care provider if: °· You have: °? Vaginal discharge that has a bad smell. °? Difficulty urinating. °? Pain when urinating. °? A sudden increase or decrease in the frequency of your bowel movements. °? More redness, swelling, or pain around your episiotomy or vaginal tear. °? More fluid or blood coming from your episiotomy or  vaginal tear. °? Pus or a bad smell coming from your episiotomy or vaginal tear. °? A fever. °? A rash. °? Little or no interest in activities you used to enjoy. °? Questions about caring for yourself or your baby. °· Your episiotomy or vaginal tear feels warm to the touch. °· Your episiotomy or vaginal tear is separating or does not appear to be healing. °· Your breasts are painful, hard, or turn red. °· You feel unusually sad or worried. °· You feel nauseous or you vomit. °· You pass large blood clots from your vagina. If you pass a blood clot from your vagina, save it to show to your health care provider. Do not flush blood clots down the toilet without having your health care provider look at them. °· You urinate more than usual. °· You are dizzy or light-headed. °· You have not breastfed at all and you have not had a menstrual period for 12 weeks after delivery. °· You have stopped breastfeeding and you have not had a menstrual period for 12 weeks after you stopped breastfeeding. °Get help right away if: °· You have: °? Pain that does not go away or does not get better with medicine. °? Chest pain. °? Difficulty breathing. °? Blurred vision or spots in your vision. °? Thoughts about hurting yourself or your baby. °· You develop pain in your abdomen or in one of your legs. °· You develop a severe headache. °· You faint. °· You bleed from your vagina so much that you fill two sanitary pads in one hour. °This information is not intended to replace advice given to you by your health care provider. Make sure you discuss any questions you have with your health care provider. °Document Released: 09/21/2000 Document Revised: 03/07/2016 Document Reviewed: 10/09/2015 °Elsevier Interactive Patient Education © 2018 [employer] ° ° °Contraception Choices °Contraception, also called birth control, refers to methods or devices that prevent pregnancy. °Hormonal methods °Contraceptive implant °A contraceptive implant is a  thin, plastic tube that contains a hormone. It is inserted into the upper part of the arm. It can remain in place for up to 3 years. °Progestin-only injections °Progestin-only injections are injections of progestin, a synthetic form of the hormone progesterone. They are given every 3 months by a health care provider. °Birth control pills °Birth control pills are pills that contain hormones that prevent pregnancy. They must be taken once a day, preferably at the same time each day. °Birth control patch °The birth control patch contains hormones that prevent pregnancy. It is placed on the skin and must be changed once a week for three weeks and removed on the fourth week. A prescription is needed to use this method of contraception. °Vaginal ring °A vaginal ring contains hormones that prevent pregnancy. It is placed in the vagina for three weeks and removed on the fourth week. After that, the process is repeated with a new ring. A prescription is needed to use this method of contraception. °Emergency contraceptive °Emergency contraceptives   prevent pregnancy after unprotected sex. They come in pill form and can be taken up to 5 days after sex. They work best the sooner they are taken after having sex. Most emergency contraceptives are available without a prescription. This method should not be used as your only form of birth control. °Barrier methods °Female condom °A female condom is a thin sheath that is worn over the penis during sex. Condoms keep sperm from going inside a woman's body. They can be used with a spermicide to increase their effectiveness. They should be disposed after a single use. °Female condom °A female condom is a soft, loose-fitting sheath that is put into the vagina before sex. The condom keeps sperm from going inside a woman's body. They should be disposed after a single use. °Diaphragm °A diaphragm is a soft, dome-shaped barrier. It is inserted into the vagina before sex, along with a spermicide.  The diaphragm blocks sperm from entering the uterus, and the spermicide kills sperm. A diaphragm should be left in the vagina for 6-8 hours after sex and removed within 24 hours. °A diaphragm is prescribed and fitted by a health care provider. A diaphragm should be replaced every 1-2 years, after giving birth, after gaining more than 15 lb (6.8 kg), and after pelvic surgery. °Cervical cap °A cervical cap is a round, soft latex or plastic cup that fits over the cervix. It is inserted into the vagina before sex, along with spermicide. It blocks sperm from entering the uterus. The cap should be left in place for 6-8 hours after sex and removed within 48 hours. A cervical cap must be prescribed and fitted by a health care provider. It should be replaced every 2 years. °Sponge °A sponge is a soft, circular piece of polyurethane foam with spermicide on it. The sponge helps block sperm from entering the uterus, and the spermicide kills sperm. To use it, you make it wet and then insert it into the vagina. It should be inserted before sex, left in for at least 6 hours after sex, and removed and thrown away within 30 hours. °Spermicides °Spermicides are chemicals that kill or block sperm from entering the cervix and uterus. They can come as a cream, jelly, suppository, foam, or tablet. A spermicide should be inserted into the vagina with an applicator at least 10-15 minutes before sex to allow time for it to work. The process must be repeated every time you have sex. Spermicides do not require a prescription. °Intrauterine contraception °Intrauterine device (IUD) °An IUD is a T-shaped device that is put in a woman's uterus. There are two types: °· Hormone IUD.This type contains progestin, a synthetic form of the hormone progesterone. This type can stay in place for 3-5 years. °· Copper IUD.This type is wrapped in copper wire. It can stay in place for 10 years. ° °Permanent methods of contraception °Female tubal ligation °In  this method, a woman's fallopian tubes are sealed, tied, or blocked during surgery to prevent eggs from traveling to the uterus. °Hysteroscopic sterilization °In this method, a small, flexible insert is placed into each fallopian tube. The inserts cause scar tissue to form in the fallopian tubes and block them, so sperm cannot reach an egg. The procedure takes about 3 months to be effective. Another form of birth control must be used during those 3 months. °Female sterilization °This is a procedure to tie off the tubes that carry sperm (vasectomy). After the procedure, the man can still ejaculate fluid (semen). °Natural   planning methods °Natural family planning °In this method, a couple does not have sex on days when the woman could become pregnant. °Calendar method °This means keeping track of the length of each menstrual cycle, identifying the days when pregnancy can happen, and not having sex on those days. °Ovulation method °In this method, a couple avoids sex during ovulation. °Symptothermal method °This method involves not having sex during ovulation. The woman typically checks for ovulation by watching changes in her temperature and in the consistency of cervical mucus. °Post-ovulation method °In this method, a couple waits to have sex until after ovulation. °Summary °· Contraception, also called birth control, means methods or devices that prevent pregnancy. °· Hormonal methods of contraception include implants, injections, pills, patches, vaginal rings, and emergency contraceptives. °· Barrier methods of contraception can include female condoms, female condoms, diaphragms, cervical caps, sponges, and spermicides. °· There are two types of IUDs (intrauterine devices). An IUD can be put in a woman's uterus to prevent pregnancy for 3-5 years. °· Permanent sterilization can be done through a procedure for males, females, or both. °· Natural family planning methods involve not having sex on days when the woman could  become pregnant. °This information is not intended to replace advice given to you by your health care provider. Make sure you discuss any questions you have with your health care provider. °Document Released: 09/24/2005 Document Revised: 10/27/2016 Document Reviewed: 10/27/2016 °Elsevier Interactive Patient Education © 2018 [employer] Inc. ° °

## 2018-07-02 NOTE — Discharge Summary (Addendum)
OB Discharge Summary     Patient Name: Maureen [name] DOB: [birth year] MRN: [id number]  Date of admission: 07/01/2018 Delivering MD: Arvilla Market   Date of discharge: 07/02/2018  Admitting diagnosis: 39 wks ctx every 1 to 2 min Intrauterine pregnancy: [redacted]w[redacted]d     Secondary diagnosis:  Active Problems:   Indication for care in labor or delivery  Additional problems: no prenatal care     Discharge diagnosis: Term Pregnancy Delivered                                                                                                Post partum procedures:none  Augmentation: AROM  Complications: None  Hospital course:  Onset of Labor With Vaginal Delivery     27 y.o. yo [id number] at [redacted]w[redacted]d was admitted in Active Labor on 07/01/2018. Patient had an uncomplicated labor course as follows:  Membrane Rupture Time/Date: 7:39 AM ,07/01/2018   Intrapartum Procedures: Episiotomy: None [1]                                         Lacerations:  None [1]  Patient had a delivery of a Viable infant. 07/01/2018  Information for the patient's newborn:  Vannie, Hilgert Girl Mariyana [[id number]]  Delivery Method: Vag-Spont    Pateint had an uncomplicated postpartum course.  She is ambulating, tolerating a regular diet, passing flatus, and urinating well. Patient is discharged home in stable condition on 07/02/18.   Physical exam  Vitals:   07/01/18 1554 07/01/18 2015 07/02/18 0028 07/02/18 0523  BP: 120/79 138/85 133/78 119/73  Pulse: 66 83 96 80  Resp: 20 20 20 18   Temp: 98 F (36.7 C) 99.2 F (37.3 C) 98.6 F (37 C) 98.1 F (36.7 C)  TempSrc: Oral Oral Oral Oral  SpO2:      Weight:      Height:       General: alert, cooperative and no distress Lochia: appropriate Uterine Fundus: firm Incision: N/A DVT Evaluation: No evidence of DVT seen on physical exam. Labs: Lab Results  Component Value Date   WBC 8.0 07/01/2018   HGB 10.2 (L) 07/01/2018   HCT 31.7 (L) 07/01/2018   MCV 79.4  07/01/2018   PLT 318 07/01/2018   CMP Latest Ref Rng & Units 12/01/2017  Glucose 65 - 99 mg/dL 83  BUN 6 - 20 mg/dL 6  Creatinine 1.30 - 8.65 mg/dL 7.84  Sodium 696 - 295 mmol/L 133(L)  Potassium 3.5 - 5.1 mmol/L 3.9  Chloride 101 - 111 mmol/L 104  CO2 22 - 32 mmol/L 20(L)  Calcium 8.9 - 10.3 mg/dL 8.9  Total Protein 6.5 - 8.1 g/dL 6.7  Total Bilirubin 0.3 - 1.2 mg/dL 2.8(U)  Alkaline Phos 38 - 126 U/L 69  AST 15 - 41 U/L 12(L)  ALT 14 - 54 U/L 8(L)    Discharge instruction: per After Visit Summary and "Baby and Me Booklet".  After visit meds:  Allergies as of 07/02/2018   No Known Allergies  Medication List    TAKE these medications   acetaminophen 325 MG tablet Commonly known as:  TYLENOL Take 2 tablets (650 mg total) by mouth every 4 (four) hours as needed (for pain scale < 4).   ibuprofen 600 MG tablet Commonly known as:  ADVIL,MOTRIN Take 1 tablet (600 mg total) by mouth every 6 (six) hours.   prenatal multivitamin Tabs tablet Take 1 tablet by mouth daily at 12 noon.   senna-docusate 8.6-50 MG tablet Commonly known as:  Senokot-S Take 2 tablets by mouth daily. Start taking on:  07/03/2018       Diet: routine diet  Activity: Advance as tolerated. Pelvic rest for 6 weeks.   Outpatient follow up:6 weeks Follow up Appt:No future appointments. Follow up Visit:No follow-ups on file.  Postpartum contraception: LARC, undecided  Newborn Data: Live born female  Birth Weight: 6 lb 6.8 oz (2914 g) APGAR: 7, 9  Newborn Delivery   Birth date/time:  07/01/2018 07:45:00 Delivery type:  Vaginal, Spontaneous     Baby Feeding: Breast Disposition:home with mother   07/02/2018 Denzil Hughes, MD  OB FELLOW DISCHARGE ATTESTATION  I have seen and examined this patient and agree with above documentation in the resident's note.   Marcy Siren, D.O. OB Fellow  07/02/2018, 3:34 PM

## 2018-07-03 ENCOUNTER — Ambulatory Visit: Payer: Self-pay

## 2018-07-03 NOTE — Lactation Note (Signed)
This note was copied from a baby's chart. Lactation Consultation Note  Patient Name: Girl Carolin Quang [id number] Date: 07/03/2018 Reason for consult: Follow-up assessment  Mom reports that with her 1st baby, her milk had come to volume on the 3rd day postpartum. Mom is 49 hours postpartum & reports that her breasts feel considerably heavier. Mom reports that she can recognize the sound of swallows.   Mom inquired about infant's weight loss. I reassured Mom, as infant has had 9 voids & 5 BMs since birth. Mom was also made aware that infants have until 7-10 days of life to return to birth weight.   Mom has a Spectra pump at home.  Lurline Hare [facility] 07/03/2018, 9:20 AM

## 2018-07-16 ENCOUNTER — Encounter: Payer: Self-pay | Admitting: Student

## 2018-07-16 ENCOUNTER — Ambulatory Visit (INDEPENDENT_AMBULATORY_CARE_PROVIDER_SITE_OTHER): Payer: BLUE CROSS/BLUE SHIELD | Admitting: Student

## 2018-07-16 NOTE — Patient Instructions (Signed)
Contraception Choices Contraception, also called birth control, refers to methods or devices that prevent pregnancy. Hormonal methods Contraceptive implant A contraceptive implant is a thin, plastic tube that contains a hormone. It is inserted into the upper part of the arm. It can remain in place for up to 3 years. Progestin-only injections Progestin-only injections are injections of progestin, a synthetic form of the hormone progesterone. They are given every 3 months by a health care provider. Birth control pills Birth control pills are pills that contain hormones that prevent pregnancy. They must be taken once a day, preferably at the same time each day. Birth control patch The birth control patch contains hormones that prevent pregnancy. It is placed on the skin and must be changed once a week for three weeks and removed on the fourth week. A prescription is needed to use this method of contraception. Vaginal ring A vaginal ring contains hormones that prevent pregnancy. It is placed in the vagina for three weeks and removed on the fourth week. After that, the process is repeated with a new ring. A prescription is needed to use this method of contraception. Emergency contraceptive Emergency contraceptives prevent pregnancy after unprotected sex. They come in pill form and can be taken up to 5 days after sex. They work best the sooner they are taken after having sex. Most emergency contraceptives are available without a prescription. This method should not be used as your only form of birth control. Barrier methods Female condom A female condom is a thin sheath that is worn over the penis during sex. Condoms keep sperm from going inside a woman's body. They can be used with a spermicide to increase their effectiveness. They should be disposed after a single use. Female condom A female condom is a soft, loose-fitting sheath that is put into the vagina before sex. The condom keeps sperm from going  inside a woman's body. They should be disposed after a single use. Diaphragm A diaphragm is a soft, dome-shaped barrier. It is inserted into the vagina before sex, along with a spermicide. The diaphragm blocks sperm from entering the uterus, and the spermicide kills sperm. A diaphragm should be left in the vagina for 6-8 hours after sex and removed within 24 hours. A diaphragm is prescribed and fitted by a health care provider. A diaphragm should be replaced every 1-2 years, after giving birth, after gaining more than 15 lb (6.8 kg), and after pelvic surgery. Cervical cap A cervical cap is a round, soft latex or plastic cup that fits over the cervix. It is inserted into the vagina before sex, along with spermicide. It blocks sperm from entering the uterus. The cap should be left in place for 6-8 hours after sex and removed within 48 hours. A cervical cap must be prescribed and fitted by a health care provider. It should be replaced every 2 years. Sponge A sponge is a soft, circular piece of polyurethane foam with spermicide on it. The sponge helps block sperm from entering the uterus, and the spermicide kills sperm. To use it, you make it wet and then insert it into the vagina. It should be inserted before sex, left in for at least 6 hours after sex, and removed and thrown away within 30 hours. Spermicides Spermicides are chemicals that kill or block sperm from entering the cervix and uterus. They can come as a cream, jelly, suppository, foam, or tablet. A spermicide should be inserted into the vagina with an applicator at least 10-15 minutes before   sex to allow time for it to work. The process must be repeated every time you have sex. Spermicides do not require a prescription. Intrauterine contraception Intrauterine device (IUD) An IUD is a T-shaped device that is put in a woman's uterus. There are two types:  Hormone IUD.This type contains progestin, a synthetic form of the hormone progesterone. This  type can stay in place for 3-5 years.  Copper IUD.This type is wrapped in copper wire. It can stay in place for 10 years.  Permanent methods of contraception Female tubal ligation In this method, a woman's fallopian tubes are sealed, tied, or blocked during surgery to prevent eggs from traveling to the uterus. Hysteroscopic sterilization In this method, a small, flexible insert is placed into each fallopian tube. The inserts cause scar tissue to form in the fallopian tubes and block them, so sperm cannot reach an egg. The procedure takes about 3 months to be effective. Another form of birth control must be used during those 3 months. Female sterilization This is a procedure to tie off the tubes that carry sperm (vasectomy). After the procedure, the man can still ejaculate fluid (semen). Natural planning methods Natural family planning In this method, a couple does not have sex on days when the woman could become pregnant. Calendar method This means keeping track of the length of each menstrual cycle, identifying the days when pregnancy can happen, and not having sex on those days. Ovulation method In this method, a couple avoids sex during ovulation. Symptothermal method This method involves not having sex during ovulation. The woman typically checks for ovulation by watching changes in her temperature and in the consistency of cervical mucus. Post-ovulation method In this method, a couple waits to have sex until after ovulation. Summary  Contraception, also called birth control, means methods or devices that prevent pregnancy.  Hormonal methods of contraception include implants, injections, pills, patches, vaginal rings, and emergency contraceptives.  Barrier methods of contraception can include female condoms, female condoms, diaphragms, cervical caps, sponges, and spermicides.  There are two types of IUDs (intrauterine devices). An IUD can be put in a woman's uterus to prevent pregnancy  for 3-5 years.  Permanent sterilization can be done through a procedure for males, females, or both.  Natural family planning methods involve not having sex on days when the woman could become pregnant. This information is not intended to replace advice given to you by your health care provider. Make sure you discuss any questions you have with your health care provider. Document Released: 09/24/2005 Document Revised: 10/27/2016 Document Reviewed: 10/27/2016 Elsevier Interactive Patient Education  2018 [employer]  

## 2018-07-16 NOTE — Progress Notes (Signed)
Pt is 2 weeks postpartum and reports moderate vaginal bleeding, denies bowel or bladder issues and reports she wants to talk about birth control options.

## 2018-07-16 NOTE — Progress Notes (Signed)
History:  Ms. Maureen [name] is a 27 y.o. [phone] who presents to clinic today for PP f/u. She is 2 weeks s/p SVD. She has a PP visit scheduled for later this month but present sooner for check up per Dr. Earlene Plater' request.  Her & baby are doing well. She is interested in contraception but undecided at this time. Denies abdominal pain. Reports some bleeding off & on since delivery.    Patient Active Problem List   Diagnosis Date Noted  . Indication for care in labor or delivery 07/01/2018  . Normal labor 12/14/2016  . Supervision of normal first pregnancy, antepartum 05/30/2016    No Known Allergies  Current Outpatient Medications on File Prior to Visit  Medication Sig Dispense Refill  . ibuprofen (ADVIL,MOTRIN) 600 MG tablet Take 1 tablet (600 mg total) by mouth every 6 (six) hours. 30 tablet 0  . Prenatal Vit-Fe Fumarate-FA (PRENATAL MULTIVITAMIN) TABS tablet Take 1 tablet by mouth daily at 12 noon.    Marland Kitchen acetaminophen (TYLENOL) 325 MG tablet Take 2 tablets (650 mg total) by mouth every 4 (four) hours as needed (for pain scale < 4). (Patient not taking: Reported on 07/16/2018) 30 tablet 0   No current facility-administered medications on file prior to visit.      The following portions of the patient's history were reviewed and updated as appropriate: allergies, current medications, family history, past medical history, social history, past surgical history and problem list.  Review of Systems:  Other than those mentioned in HPI all ROS negative   Objective:  Physical Exam BP 130/83   Pulse 78   Ht 5\' 6"  (1.676 m)   Wt 208 lb 9.6 oz (94.6 kg)   Breastfeeding? Yes   BMI 33.67 kg/m  CONSTITUTIONAL: Well-developed, well-nourished female in no acute distress.  EYES: EOM intact, conjunctivae normal, no scleral icterus HEAD: Normocephalic, atraumatic RESPIRATORY:  Effort and breath sounds normal, no problems with respiration noted. PSYCHIATRIC: Normal mood and affect. Normal behavior.  Normal judgment and thought content.   Labs and Imaging None today  Assessment & Plan:  1. Postpartum care and examination -doing well -VSS -discussed contraception options. Patient leaning twds LARC but undecided. Will provide with education in her AVS. Pt to decide on contraception for her official PP visit.    Judeth Horn, NP 07/16/2018 8:35 PM

## 2018-07-30 ENCOUNTER — Telehealth: Payer: Self-pay | Admitting: Family Medicine

## 2018-07-30 NOTE — Telephone Encounter (Signed)
Called pt and left VM about FMLA papers being ready and can be picked up at pt's convenience.

## 2018-08-05 ENCOUNTER — Ambulatory Visit: Payer: BLUE CROSS/BLUE SHIELD | Admitting: Student

## 2018-08-05 ENCOUNTER — Ambulatory Visit: Payer: BLUE CROSS/BLUE SHIELD | Admitting: Advanced Practice Midwife

## 2018-08-05 DIAGNOSIS — Z029 Encounter for administrative examinations, unspecified: Secondary | ICD-10-CM

## 2018-08-06 ENCOUNTER — Ambulatory Visit: Payer: BLUE CROSS/BLUE SHIELD | Admitting: Obstetrics and Gynecology

## 2018-08-06 ENCOUNTER — Ambulatory Visit: Payer: BLUE CROSS/BLUE SHIELD | Admitting: Student

## 2018-10-13 ENCOUNTER — Ambulatory Visit (INDEPENDENT_AMBULATORY_CARE_PROVIDER_SITE_OTHER): Payer: BLUE CROSS/BLUE SHIELD | Admitting: *Deleted

## 2018-10-13 DIAGNOSIS — Z113 Encounter for screening for infections with a predominantly sexual mode of transmission: Secondary | ICD-10-CM

## 2018-10-13 DIAGNOSIS — N898 Other specified noninflammatory disorders of vagina: Secondary | ICD-10-CM | POA: Diagnosis not present

## 2018-10-13 DIAGNOSIS — Z202 Contact with and (suspected) exposure to infections with a predominantly sexual mode of transmission: Secondary | ICD-10-CM

## 2018-10-13 NOTE — Progress Notes (Signed)
C/o vaginal itching , redness, with yellowish/brownish vaginal discharge.Thought it was yeast and took 3 day monistat with no relief .Wants to self swab for wet prep for gc, yeast, bv, trich.

## 2018-10-13 NOTE — Progress Notes (Signed)
I have reviewed the chart and agree with nursing staff's documentation of this patient's encounter.  Thressa Sheller DNP, CNM  10/13/18  4:53 PM

## 2018-10-15 LAB — CERVICOVAGINAL ANCILLARY ONLY
Bacterial vaginitis: NEGATIVE
CANDIDA VAGINITIS: NEGATIVE
Chlamydia: NEGATIVE
NEISSERIA GONORRHEA: NEGATIVE
TRICH (WINDOWPATH): POSITIVE — AB

## 2018-10-20 ENCOUNTER — Telehealth: Payer: Self-pay | Admitting: *Deleted

## 2018-10-20 ENCOUNTER — Other Ambulatory Visit: Payer: Self-pay | Admitting: Advanced Practice Midwife

## 2018-10-20 MED ORDER — METRONIDAZOLE 500 MG PO TABS: 2000.0000 mg | ORAL_TABLET | Freq: Once | ORAL | 0 refills | Status: AC

## 2018-10-20 NOTE — Telephone Encounter (Signed)
-----   Message from Armando ReichertHeather D Hogan, CNM sent at 10/20/2018  8:19 AM EST ----- Patient has trichomonas. Please call her. I will send in a rx to her pharmacy.

## 2018-10-20 NOTE — Telephone Encounter (Signed)
Called patient, no answer- left message stating we are trying to reach you to return your phone call. I did receive your voicemail message and yes that medication is safe to take. You may call us back if you have other questions.

## 2018-10-20 NOTE — Telephone Encounter (Signed)
Called pt to inform her that she tested positive for trich and that flagyl was sent into the [facility] on [address].  Pt did not pick up.  Mychart message sent.

## 2018-10-20 NOTE — Telephone Encounter (Signed)
Maureen [name] left a message today at 78 stating she just needs verificatin if a med is safe with breastmilk.

## 2019-08-11 IMAGING — US US OB COMP LESS 14 WK
1 series · 15 of 28 positions shown · non-contrast
Comparison: None.

CLINICAL DATA: Vaginal bleeding beginning this morning. Gestational
age by LMP of 9 weeks 3 days.

EXAM:
OBSTETRIC <14 WK US AND TRANSVAGINAL OB US
TECHNIQUE: Both transabdominal and transvaginal ultrasound examinations were
performed for complete evaluation of the gestation as well as the
maternal uterus, adnexal regions, and pelvic cul-de-sac.
Transvaginal technique was performed to assess early pregnancy.

[Series 1: us ob comp less 14 wk · 15 of 70 slices shown]
[im 1/70]
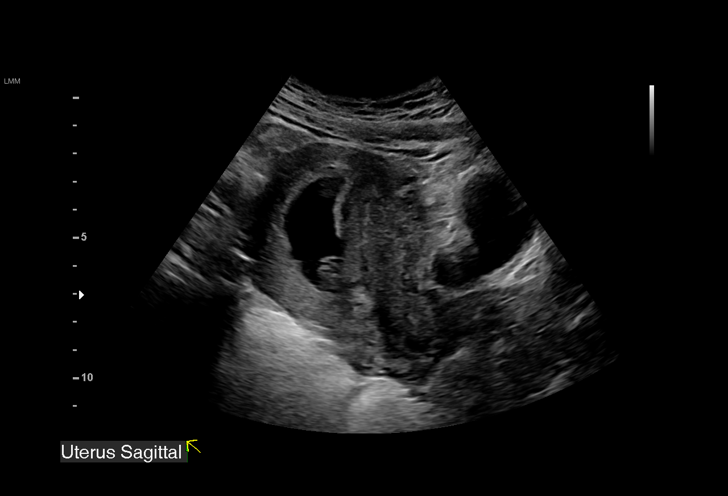
[im 6/70]
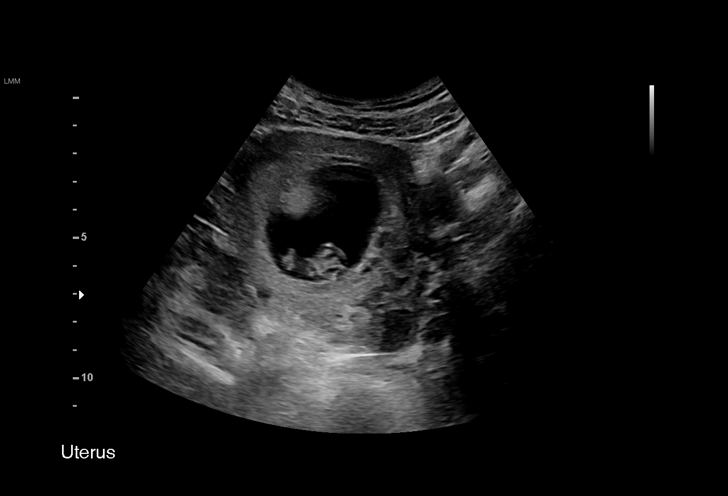
[im 11/70]
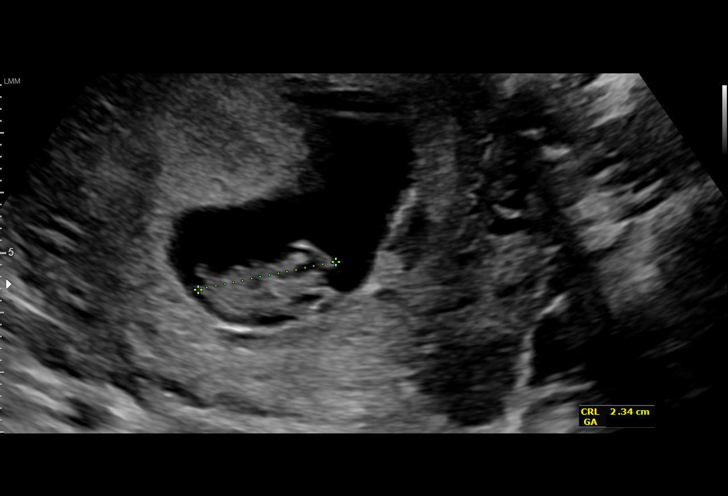
[im 16/70]
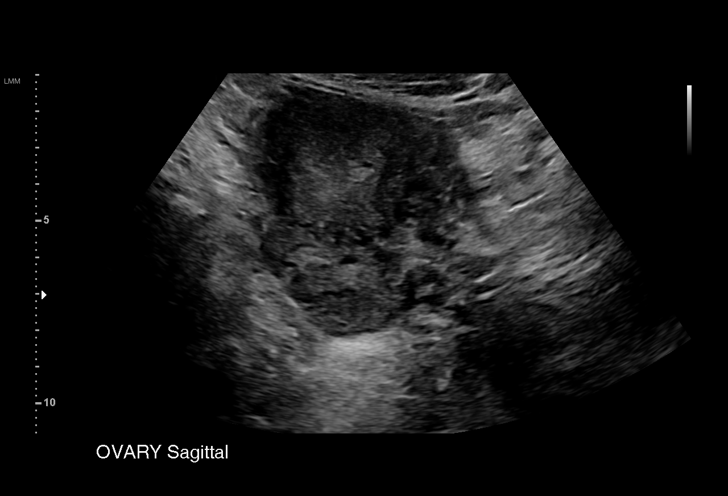
[im 21/70]
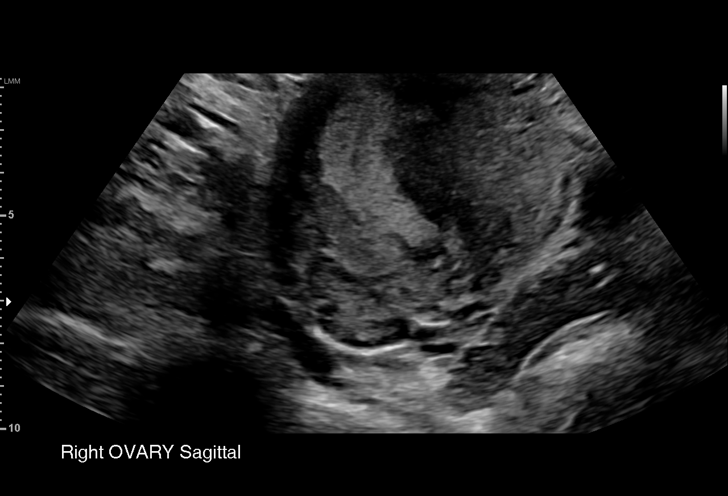
[im 26/70]
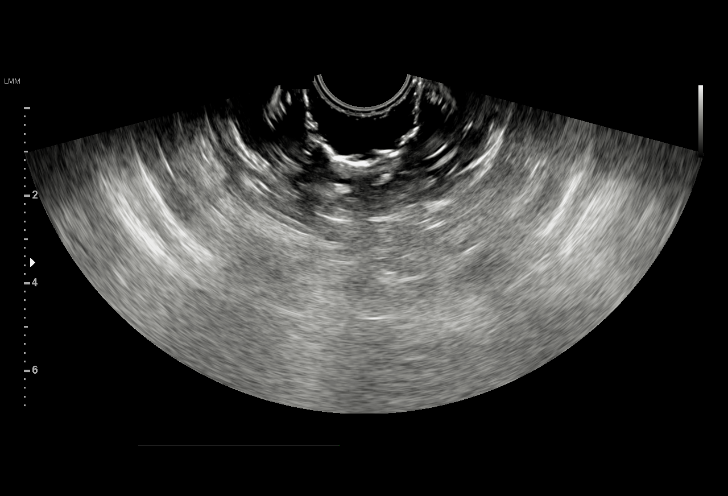
[im 31/70]
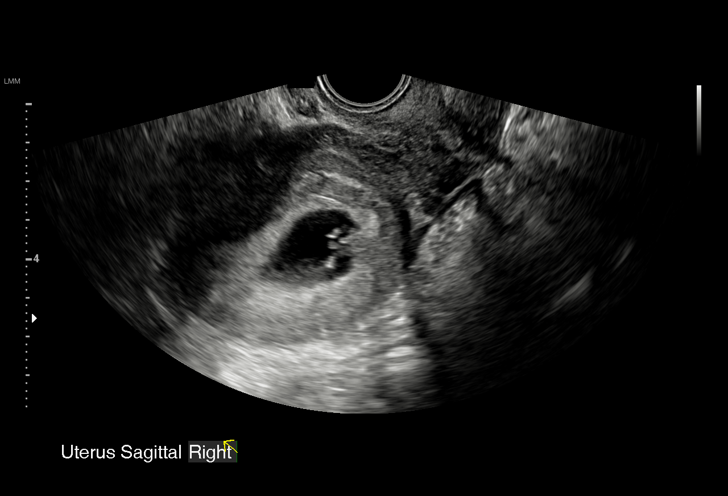
[im 36/70]
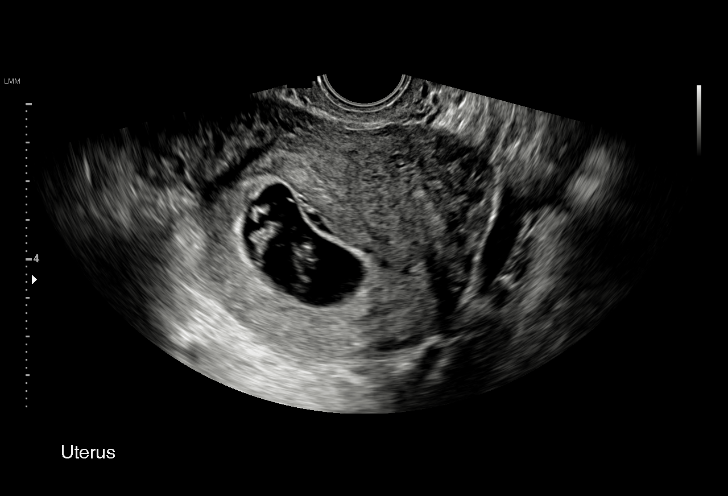
[im 39/70]
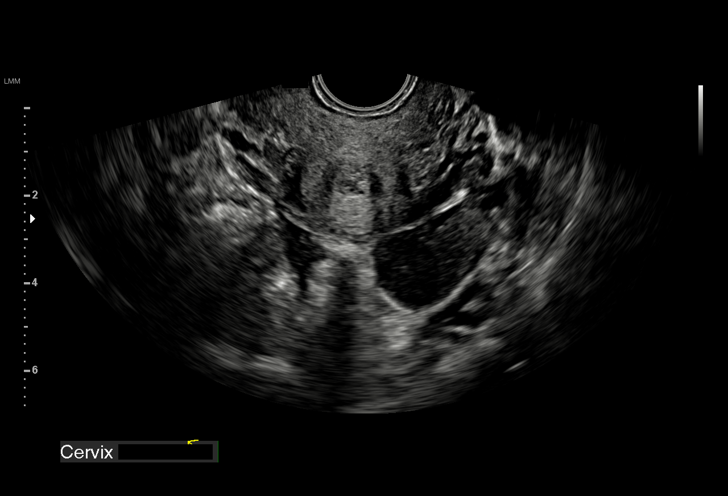
[im 44/70]
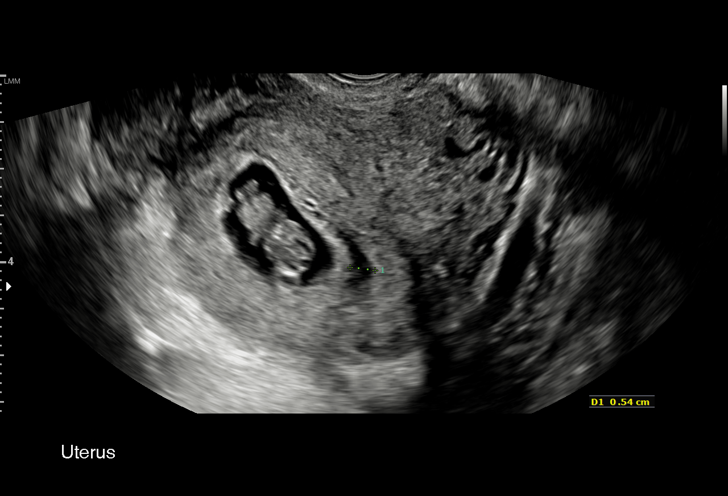
[im 49/70]
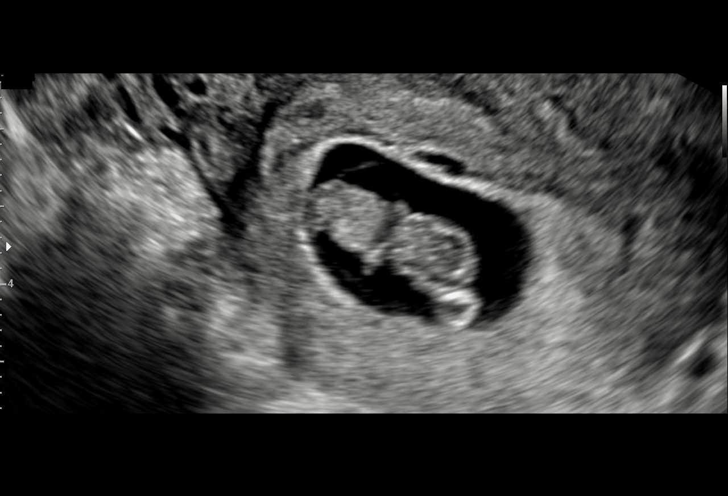
[im 54/70]
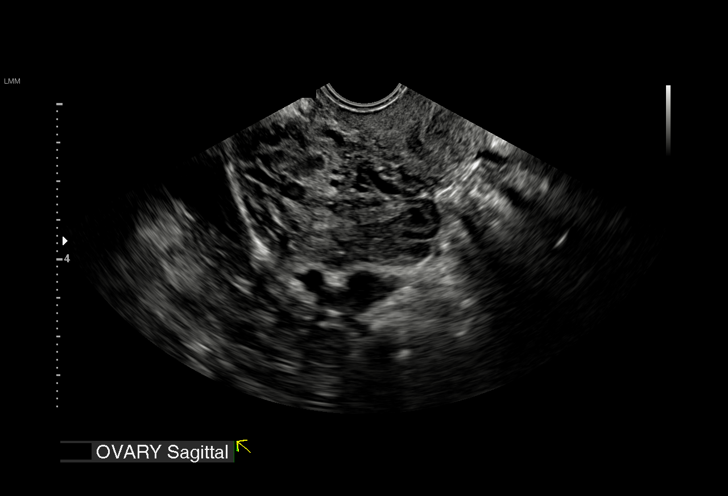
[im 59/70]
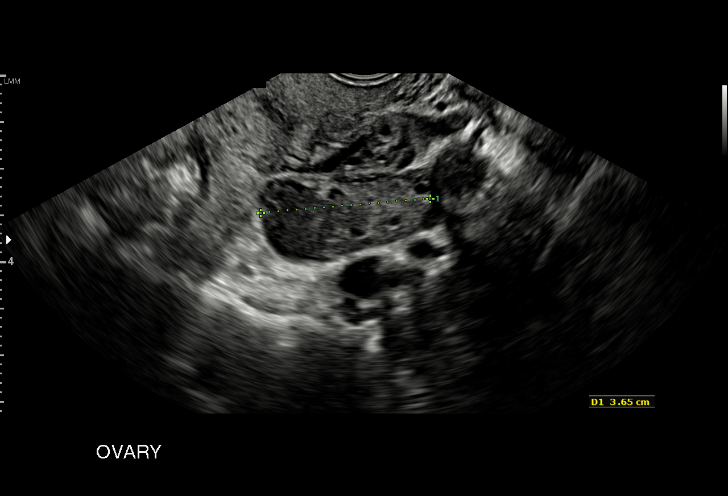
[im 64/70]
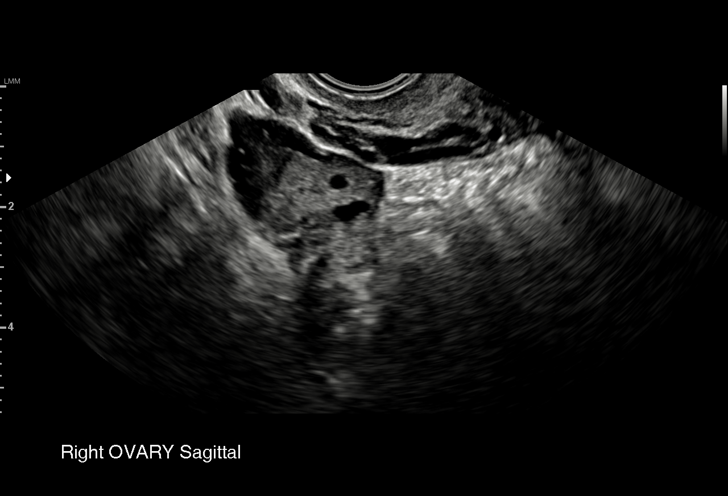
[im 70/70]
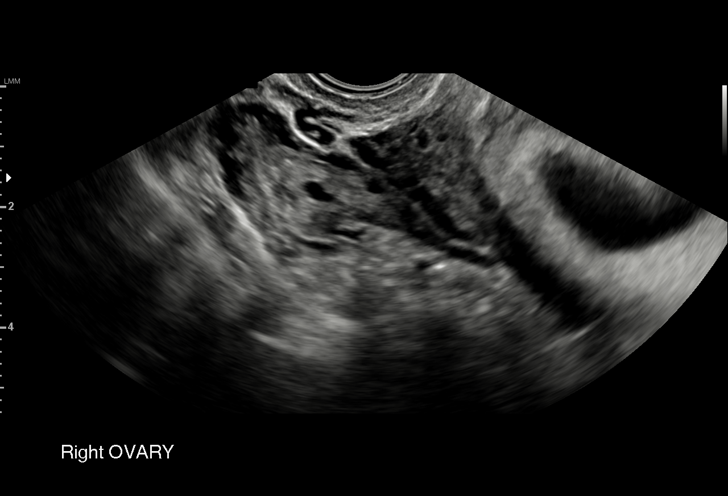

[15 of 28 positions shown; findings below may reference images not displayed]

FINDINGS: Intrauterine gestational sac: Single

Yolk sac:  Visualized.

Embryo:  Visualized.

Cardiac Activity: Visualized.

Heart Rate: 177 bpm

CRL:  23 mm   9 w   0 d                  US EDC: 07/06/2018

Subchorionic hemorrhage:  A small subchorionic hemorrhage is noted.

Maternal uterus/adnexae: Normal appearance of both ovaries. No mass
or free fluid identified.
IMPRESSION: Single living IUP measuring 9 weeks 0 days, which is concordant with
LMP.

Small subchorionic hemorrhage.
